# Patient Record
Sex: Female | Born: 1985 | Race: White | Hispanic: No | Marital: Married | Smoking: Former smoker
Health system: Southern US, Community
[De-identification: ages and names within clinical notes are randomized; demographics above are authoritative.]

## PROBLEM LIST (undated history)

## (undated) DIAGNOSIS — Z720 Tobacco use: Secondary | ICD-10-CM

## (undated) DIAGNOSIS — Z789 Other specified health status: Secondary | ICD-10-CM

## (undated) HISTORY — PX: NO PAST SURGERIES: SHX2092

---
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
## 2018-12-11 ENCOUNTER — Other Ambulatory Visit: Payer: Self-pay

## 2018-12-12 ENCOUNTER — Encounter: Payer: Self-pay | Admitting: Family

## 2018-12-12 ENCOUNTER — Ambulatory Visit (INDEPENDENT_AMBULATORY_CARE_PROVIDER_SITE_OTHER): Payer: No Typology Code available for payment source | Admitting: Family

## 2018-12-12 ENCOUNTER — Other Ambulatory Visit: Payer: Self-pay

## 2018-12-12 VITALS — Temp 97.8°F | Ht 62.0 in | Wt 130.0 lb

## 2018-12-12 DIAGNOSIS — R05 Cough: Secondary | ICD-10-CM | POA: Diagnosis not present

## 2018-12-12 DIAGNOSIS — R059 Cough, unspecified: Secondary | ICD-10-CM

## 2018-12-12 DIAGNOSIS — Z720 Tobacco use: Secondary | ICD-10-CM | POA: Insufficient documentation

## 2018-12-12 HISTORY — DX: Tobacco use: Z72.0

## 2018-12-12 MED ORDER — CHANTIX STARTING MONTH PAK 0.5 MG X 11 & 1 MG X 42 PO TABS: ORAL_TABLET | ORAL | 0 refills | Status: DC

## 2018-12-12 NOTE — Progress Notes (Signed)
Virtual Visit via Video Note  I connected with Carrie Sheppard on 12/12/18 at 10:00 AM EDT by a video enabled telemedicine application and verified that I am speaking with the correct person using two identifiers.  Location: Patient: car Provider: Work office   I discussed the limitations of evaluation and management by telemedicine and the availability of in person appointments. The patient expressed understanding and agreed to proceed.  History of Present Illness:  Patient is a 33 yr old female who presents today to establish care.  She was initially scheduled for a face to face visit, but reported to staff that she awoke today cough and mild nasal congestion today so we transitioned to a virtual visit for COVID-19 precautions. She tells me that she is feeling some better after being up for a while.   Pt states that she moved here 1 year ago for from [place] for her job. She works in Aeronautical engineer.   She reports that she smokes 1/2 PPD and is interested in quitting smoking. States she has heard good things about chantix and is interested in trying chantix. States she has tried to quit in the past several times but has only made it a few weeks at a time.  Past Medical History:  Diagnosis Date  . Tobacco abuse 12/12/2018     Social History   Socioeconomic History  . Marital status: Single    Spouse name: Not on file  . Number of children: Not on file  . Years of education: Not on file  . Highest education level: Not on file  Occupational History  . Occupation: development at [employer]  . Financial resource strain: Not hard at all  . Food insecurity    Worry: Never true    Inability: Never true  . Transportation needs    Medical: No    Non-medical: No  Tobacco Use  . Smoking status: Current Every Day Smoker    Packs/day: 0.50    Years: 15.00    Pack years: 7.50    Types: Cigarettes  . Smokeless tobacco: Never Used  Substance and Sexual  Activity  . Alcohol use: Yes    Comment: wine almost every day  . Drug use: Not Currently  . Sexual activity: Yes    Partners: Male    Birth control/protection: None  Lifestyle  . Physical activity    Days per week: 0 days    Minutes per session: Not on file  . Stress: Not on file  Relationships  . Social connections    Talks on phone: More than three times a week    Gets together: Never    Attends religious service: Never    Active member of club or organization: No    Attends meetings of clubs or organizations: Never    Relationship status: Not on file  . Intimate partner violence    Fear of current or ex partner: Not on file    Emotionally abused: Not on file    Physically abused: Not on file    Forced sexual activity: Not on file  Other Topics Concern  . Not on file  Social History Narrative   Works in the [place] to [place] from [place]   No children   Has a dog   Enjoys netflix, some hiking     History reviewed. No pertinent surgical history.  Family History  Problem Relation Age of Onset  . Diabetes  Brother   . Diabetes Sister     Not on File  No current outpatient medications on file prior to visit.   No current facility-administered medications on file prior to visit.     Ht 5\' 2"  (1.575 m)   Wt 130 lb (59 kg)   LMP 11/16/2018   BMI 23.78 kg/m   ROS:  Const: reports stable weight in the last 6 months ENT: + nasal congestion, denies sore throat Resp: mild cough GU: denies dysuria/frequency GI: denies constipation or diarrhea Skin: denies skin rash Neuro: denies headaches MS: denies arthralgias/myalgias Psych: denies depression/anxiety    Observations/Objective:   Gen: Awake, alert, no acute distress Resp: Breathing is even and non-labored Psych: calm/pleasant demeanor Neuro: Alert and Oriented x 3, + facial symmetry, speech is clear.   Assessment and Plan:  Tobacco abuse- discussed various options to  help her quit smoking including nicotine patch, wellbutrin and chantix. She opted for trial of chantix. Common side effects including rare risk of suicide ideation was discussed with the patient today.  Patient is instructed to go directly to the ED if this occurs.  We discussed that patient can continue to smoke for 1 week after starting chantix, but then must discontinue cigarettes.  She is also instructed to contact 01/16/2019 prior to completion of the starter month pack to update [place] on her success at which time we will send an rx for a continuation month pack.  5 minutes spent with patient today on tobacco cessation counseling.   Cough- mild symptoms today. No known covid-19 exposure. Suspect allergies.  She is advised to monitor her symptoms over the weekend and call me Monday if she has new/worsening symptoms. Would plan COVID-19 testing at that time.   20 minutes spent on today's visit.   Follow Up Instructions:    I discussed the assessment and treatment plan with the patient. The patient was provided an opportunity to ask questions and all were answered. The patient agreed with the plan and demonstrated an understanding of the instructions.   The patient was advised to call back or seek an in-person evaluation if the symptoms worsen or if the condition fails to improve as anticipated.  Sunday, NP

## 2019-01-07 ENCOUNTER — Telehealth: Payer: Self-pay | Admitting: Family

## 2019-01-07 NOTE — Telephone Encounter (Signed)
Scheduled cpe for 01/27/2019 1:20pm.  Pt needing refill on chantix. She only has a couple of days.   [facility] [place], [address] [phone] (Phone) [phone] (Fax)

## 2019-01-07 NOTE — Telephone Encounter (Signed)
10/28 11:26am Inocente Salles Can pt be worked in for cpe before the end of November? We would need approval to add additional CPE on a Tuesday or Friday.   Copied from [place] [phone]. Topic: General - Other >> Jan 07, 2019  9:47 AM Carolyn Stare wrote: Pt said she need a cpe before the end of Nov please cal lto

## 2019-01-09 MED ORDER — VARENICLINE TARTRATE 1 MG PO TABS: 1.0000 mg | ORAL_TABLET | Freq: Two times a day (BID) | ORAL | 1 refills | Status: DC

## 2019-01-09 NOTE — Telephone Encounter (Signed)
Patient advised rx was sent she said she is quiting  Successfully and no mood changes so far,.

## 2019-01-09 NOTE — Telephone Encounter (Signed)
Refill has been sent.  Please ask pt if she has been successful quitting. Also, any concerns about mood since starting chantix- depression?

## 2019-01-26 ENCOUNTER — Other Ambulatory Visit: Payer: Self-pay

## 2019-01-27 ENCOUNTER — Encounter: Payer: Self-pay | Admitting: Family

## 2019-01-27 ENCOUNTER — Other Ambulatory Visit: Payer: Self-pay

## 2019-01-27 ENCOUNTER — Ambulatory Visit (INDEPENDENT_AMBULATORY_CARE_PROVIDER_SITE_OTHER): Payer: No Typology Code available for payment source | Admitting: Family

## 2019-01-27 VITALS — BP 126/81 | HR 79 | Temp 97.8°F | Resp 16 | Ht 62.0 in | Wt 136.6 lb

## 2019-01-27 DIAGNOSIS — Z0000 Encounter for general adult medical examination without abnormal findings: Secondary | ICD-10-CM | POA: Diagnosis not present

## 2019-01-27 DIAGNOSIS — Z23 Encounter for immunization: Secondary | ICD-10-CM

## 2019-01-27 LAB — CBC WITH DIFFERENTIAL/PLATELET
Basophils Absolute: 0.1 10*3/uL (ref 0.0–0.1)
Basophils Relative: 1.3 % (ref 0.0–3.0)
Eosinophils Absolute: 0.2 10*3/uL (ref 0.0–0.7)
Eosinophils Relative: 3.7 % (ref 0.0–5.0)
HCT: 39.1 % (ref 36.0–46.0)
Hemoglobin: 13.2 g/dL (ref 12.0–15.0)
Lymphocytes Relative: 41.9 % (ref 12.0–46.0)
Lymphs Abs: 2.7 10*3/uL (ref 0.7–4.0)
MCHC: 33.8 g/dL (ref 30.0–36.0)
MCV: 86.8 fl (ref 78.0–100.0)
Monocytes Absolute: 0.4 10*3/uL (ref 0.1–1.0)
Monocytes Relative: 6.7 % (ref 3.0–12.0)
Neutro Abs: 3 10*3/uL (ref 1.4–7.7)
Neutrophils Relative %: 46.4 % (ref 43.0–77.0)
Platelets: 349 10*3/uL (ref 150.0–400.0)
RBC: 4.5 Mil/uL (ref 3.87–5.11)
RDW: 13.2 % (ref 11.5–15.5)
WBC: 6.5 10*3/uL (ref 4.0–10.5)

## 2019-01-27 LAB — HEPATIC FUNCTION PANEL
ALT: 8 U/L (ref 0–35)
AST: 10 U/L (ref 0–37)
Albumin: 4.7 g/dL (ref 3.5–5.2)
Alkaline Phosphatase: 70 U/L (ref 39–117)
Bilirubin, Direct: 0.1 mg/dL (ref 0.0–0.3)
Total Bilirubin: 0.7 mg/dL (ref 0.2–1.2)
Total Protein: 7.2 g/dL (ref 6.0–8.3)

## 2019-01-27 LAB — BASIC METABOLIC PANEL
BUN: 10 mg/dL (ref 6–23)
CO2: 24 mEq/L (ref 19–32)
Calcium: 9.4 mg/dL (ref 8.4–10.5)
Chloride: 104 mEq/L (ref 96–112)
Creatinine, Ser: 0.69 mg/dL (ref 0.40–1.20)
GFR: 98.38 mL/min (ref >60.00)
Glucose, Bld: 87 mg/dL (ref 70–99)
Potassium: 3.9 mEq/L (ref 3.5–5.1)
Sodium: 139 mEq/L (ref 135–145)

## 2019-01-27 LAB — TSH: TSH: 2.76 u[IU]/mL (ref 0.35–4.50)

## 2019-01-27 LAB — LIPID PANEL
Cholesterol: 206 mg/dL — ABNORMAL HIGH (ref 0–200)
HDL: 52.3 mg/dL (ref >39.00)
LDL Cholesterol: 135 mg/dL — ABNORMAL HIGH (ref 0–99)
NonHDL: 153.37
Total CHOL/HDL Ratio: 4
Triglycerides: 93 mg/dL (ref 0.0–149.0)
VLDL: 18.6 mg/dL (ref 0.0–40.0)

## 2019-01-27 NOTE — Progress Notes (Signed)
Subjective:    Patient ID: Carrie Sheppard, female    DOB: February 10, 1986, 33 y.o.   MRN: [id number]  HPI  Patient is a 33 yr old female who presents today for cpx.  Immunizations:  Tdap 2012-flu shot today. Diet: generally healthy Exercise: walks her dog Pap Smear:overdue, will schedule  Vision/dental: up to  date She is on her second month of chantix and has not smoked in several weeks   Review of Systems  Constitutional: Negative for unexpected weight change.  HENT: Negative for postnasal drip.   Eyes: Negative for visual disturbance.  Respiratory: Negative for cough and shortness of breath.   Cardiovascular: Negative for chest pain.  Gastrointestinal: Negative for blood in stool, constipation and diarrhea.  Genitourinary: Negative for dysuria, frequency and hematuria.  Musculoskeletal: Negative for arthralgias and myalgias.  Skin: Negative for rash.  Neurological: Negative for headaches.  Hematological: Negative for adenopathy.  Psychiatric/Behavioral:       Denies depression/anxiety   Past Medical History:  Diagnosis Date  . Tobacco abuse 12/12/2018     Social History   Socioeconomic History  . Marital status: Single    Spouse name: Not on file  . Number of children: Not on file  . Years of education: Not on file  . Highest education level: Not on file  Occupational History  . Occupation: development at [employer]  Social Needs  . Financial resource strain: Not hard at all  . Food insecurity    Worry: Never true    Inability: Never true  . Transportation needs    Medical: No    Non-medical: No  Tobacco Use  . Smoking status: Former Smoker    Packs/day: 0.50    Years: 15.00    Pack years: 7.50    Types: Cigarettes  . Smokeless tobacco: Never Used  . Tobacco comment: quit 3 weeks ago  Substance and Sexual Activity  . Alcohol use: Yes    Comment: wine almost every day  . Drug use: Not Currently  . Sexual activity: Yes    Partners: Male   Birth control/protection: None  Lifestyle  . Physical activity    Days per week: 0 days    Minutes per session: Not on file  . Stress: Not on file  Relationships  . Social connections    Talks on phone: More than three times a week    Gets together: Never    Attends religious service: Never    Active member of club or organization: No    Attends meetings of clubs or organizations: Never    Relationship status: Not on file  . Intimate partner violence    Fear of current or ex partner: Not on file    Emotionally abused: Not on file    Physically abused: Not on file    Forced sexual activity: Not on file  Other Topics Concern  . Not on file  Social History Narrative   Works in the Visual merchandiser   Moved to [place] from [place]   No children   Has a dog   Enjoys netflix, some hiking     No past surgical history on file.  Family History  Problem Relation Age of Onset  . Diabetes Brother        1/2 brother  . Diabetes Sister        1/2 sister    Not on File  Current Outpatient Medications on File Prior to Visit  Medication Sig Dispense  Refill  . varenicline (CHANTIX CONTINUING MONTH PAK) 1 MG tablet Take 1 tablet (1 mg total) by mouth 2 (two) times daily. 60 tablet 1   No current facility-administered medications on file prior to visit.     BP 126/81 (BP Location: Right Arm, Patient Position: Sitting, Cuff Size: Small)   Pulse 79   Temp 97.8 F (36.6 C) (Temporal)   Resp 16   Ht 5\' 2"  (1.575 m)   Wt 136 lb 9.6 oz (62 kg)   SpO2 100%   BMI 24.98 kg/m       Objective:   Physical Exam  Physical Exam  Constitutional: She is oriented to person, place, and time. She appears well-developed and well-nourished. No distress.  HENT:  Head: Normocephalic and atraumatic.  Right Ear: Tympanic membrane and ear canal normal.  Left Ear: Tympanic membrane and ear canal normal.  Mouth/Throat: not examined, pt wearing mask Eyes: Pupils are equal, round, and  reactive to light. No scleral icterus.  Neck: Normal range of motion. No thyromegaly present.  Cardiovascular: Normal rate and regular rhythm.   No murmur heard. Pulmonary/Chest: Effort normal and breath sounds normal. No respiratory distress. He has no wheezes. She has no rales. She exhibits no tenderness.  Abdominal: Soft. Bowel sounds are normal. She exhibits no distension and no mass. There is no tenderness. There is no rebound and no guarding.  Musculoskeletal: She exhibits no edema.  Lymphadenopathy:    She has no cervical adenopathy.  Neurological: She is alert and oriented to person, place, and time. She has normal patellar reflexes. She exhibits normal muscle tone. Coordination normal.  Skin: Skin is warm and dry.  Psychiatric: She has a normal mood and affect. Her behavior is normal. Judgment and thought content normal.  Breast/pelvis: deferred          Assessment & Plan:  Preventative care- encouraged healthy diet, exercise.  Obtain routine lab work, including nicotine metabolite at pt request for her work form. Encouraged pt to remain smoke free.  Flu shot today.      Assessment & Plan:

## 2019-01-27 NOTE — Patient Instructions (Signed)
Please complete lab work prior to leaving. Try to add 30 minutes of cardio 5 days a week  [place] job quitting smoking, keep up the good work!

## 2019-02-03 LAB — NICOTINE/COTININE METABOLITES
Cotinine: <1 ng/mL
Nicotine: <1 ng/mL

## 2019-02-12 ENCOUNTER — Other Ambulatory Visit: Payer: Self-pay

## 2019-02-13 ENCOUNTER — Ambulatory Visit: Payer: No Typology Code available for payment source | Admitting: Family

## 2019-02-20 ENCOUNTER — Ambulatory Visit: Payer: No Typology Code available for payment source | Admitting: Family

## 2019-03-20 ENCOUNTER — Other Ambulatory Visit (HOSPITAL_COMMUNITY)
Admission: RE | Admit: 2019-03-20 | Discharge: 2019-03-20 | Disposition: A | Discharge status: Home / Self Care | Payer: No Typology Code available for payment source | Source: Ambulatory Visit | Attending: Family | Admitting: Family

## 2019-03-20 ENCOUNTER — Other Ambulatory Visit: Payer: Self-pay

## 2019-03-20 ENCOUNTER — Ambulatory Visit (INDEPENDENT_AMBULATORY_CARE_PROVIDER_SITE_OTHER): Payer: No Typology Code available for payment source | Admitting: Family

## 2019-03-20 VITALS — BP 123/81 | HR 65 | Temp 97.4°F | Resp 16 | Ht 62.0 in | Wt 135.0 lb

## 2019-03-20 DIAGNOSIS — Z01419 Encounter for gynecological examination (general) (routine) without abnormal findings: Secondary | ICD-10-CM

## 2019-03-20 DIAGNOSIS — Z87891 Personal history of nicotine dependence: Secondary | ICD-10-CM | POA: Diagnosis not present

## 2019-03-20 NOTE — Progress Notes (Signed)
Established Patient Office Visit  Subjective:  Patient ID: Carrie Sheppard, female    DOB: 01-08-1986  Age: 34 y.o. MRN: [id number]  CC:  Chief Complaint  Patient presents with  . Gynecologic Exam    Here for pap smear    HPI Carrie Sheppard presents for Pap smear.  Patient's last menstrual period was:  Sexually active: yes The current method of family planning is: History of abnormal Pap:  Had an abnormal pap in college- reports follow up    Tobacco abuse- quit smoking in October. Used chantix the first month which helped. Then she stopped chantix.   GYN exam- normal GYN exam. Pap will be sent for HSV testing.   Past Medical History:  Diagnosis Date  . Tobacco abuse 12/12/2018    No past surgical history on file.  Family History  Problem Relation Age of Onset  . Diabetes Brother        1/2 brother  . Diabetes Sister        1/2 sister    Social History   Socioeconomic History  . Marital status: Single    Spouse name: Not on file  . Number of children: Not on file  . Years of education: Not on file  . Highest education level: Not on file  Occupational History  . 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 Smoking status: Former Smoker    Packs/day: 0.50    Years: 15.00    Pack years: 7.50    Types: Cigarettes  . Smokeless tobacco: Never Used  . Tobacco comment: quit 3 weeks ago  Substance and Sexual Activity  . Alcohol use: Yes    Comment: wine almost every day  . Drug use: Not Currently  . Sexual activity: Yes    Partners: Male    Birth control/protection: None  Other Topics Concern  . Not on file  Social History Narrative   Works in the [place] to [place] from [place]   No children   Has a dog   Enjoys netflix, some hiking    Social Determinants of Radio broadcast assistant Strain: Low Risk   . Difficulty of Paying Living Expenses: Not hard at all  Food Insecurity: No Food Insecurity  .  Worried About Charity fundraiser in the Last Year: Never true  . Ran Out of Food in the Last Year: Never true  Transportation Needs: No Transportation Needs  . Lack of Transportation (Medical): No  . Lack of Transportation (Non-Medical): No  Physical Activity: Unknown  . Days of Exercise per Week: 0 days  . Minutes of Exercise per Session: Not on file  Stress:   . Feeling of Stress : Not on file  Social Connections: Unknown  . Frequency of Communication with Friends and Family: More than three times a week  . Frequency of Social Gatherings with Friends and Family: Never  . Attends Religious Services: Never  . Active Member of Clubs or Organizations: No  . Attends Archivist Meetings: Never  . Marital Status: Not on file  Intimate Partner Violence:   . Fear of Current or Ex-Partner: Not on file  . Emotionally Abused: Not on file  . Physically Abused: Not on file  . Sexually Abused: Not on file    Outpatient Medications Prior to Visit  Medication Sig Dispense Refill  . varenicline (CHANTIX CONTINUING MONTH PAK) 1 MG tablet Take 1 tablet (1 mg total)  by mouth 2 (two) times daily. 60 tablet 1   No facility-administered medications prior to visit.    Not on File  ROS Review of Systems    Objective:    Physical Exam  Constitutional: She is oriented to person, place, and time. She appears well-developed and well-nourished. No distress.  Genitourinary:    Vagina and uterus normal.  Cervix exhibits no motion tenderness. Left adnexum displays no mass.  Musculoskeletal:        General: No edema.  Neurological: She is alert and oriented to person, place, and time.    BP 123/81 (BP Location: Right Arm, Patient Position: Sitting, Cuff Size: Small)   Pulse 65   Temp (!) 97.4 F (36.3 C) (Temporal)   Resp 16   Ht 5\' 2"  (1.575 m)   Wt 135 lb (61.2 kg)   SpO2 100%   BMI 24.69 kg/m      Health Maintenance Due  Topic Date Due  . HIV Screening  09/21/2001  . PAP  SMEAR-Modifier  09/22/2007    There are no preventive care reminders to display for this patient.  Lab Results  Component Value Date   TSH 2.76 01/27/2019   Lab Results  Component Value Date   WBC 6.5 01/27/2019   HGB 13.2 01/27/2019   HCT 39.1 01/27/2019   MCV 86.8 01/27/2019   PLT 349.0 01/27/2019   Lab Results  Component Value Date   NA 139 01/27/2019   K 3.9 01/27/2019   CO2 24 01/27/2019   GLUCOSE 87 01/27/2019   BUN 10 01/27/2019   CREATININE 0.69 01/27/2019   BILITOT 0.7 01/27/2019   ALKPHOS 70 01/27/2019   AST 10 01/27/2019   ALT 8 01/27/2019   PROT 7.2 01/27/2019   ALBUMIN 4.7 01/27/2019   CALCIUM 9.4 01/27/2019   GFR 98.38 01/27/2019   Lab Results  Component Value Date   CHOL 206 (H) 01/27/2019   Lab Results  Component Value Date   HDL 52.30 01/27/2019   Lab Results  Component Value Date   LDLCALC 135 (H) 01/27/2019   Lab Results  Component Value Date   TRIG 93.0 01/27/2019   Lab Results  Component Value Date   CHOLHDL 4 01/27/2019   No results found for: HGBA1C    Assessment & Plan:   Problem List Items Addressed This Visit    None    Visit Diagnoses    Encounter for routine gynecological examination with Papanicolaou smear of cervix    -  Primary   Relevant Orders   Cytology - PAP( [place])     Hx of tobacco abuse- commended pt on quitting smoking.   No orders of the defined types were placed in this encounter.   Follow-up: No follow-ups on file.    01/29/2019, NP

## 2019-03-23 ENCOUNTER — Encounter: Payer: Self-pay | Admitting: Family

## 2019-03-24 LAB — CYTOLOGY - PAP
Adequacy: ABSENT
Comment: NEGATIVE
Diagnosis: NEGATIVE
High risk HPV: NEGATIVE

## 2019-10-06 ENCOUNTER — Ambulatory Visit (INDEPENDENT_AMBULATORY_CARE_PROVIDER_SITE_OTHER): Payer: 59 | Admitting: Family

## 2019-10-06 ENCOUNTER — Encounter: Payer: Self-pay | Admitting: Family

## 2019-10-06 ENCOUNTER — Other Ambulatory Visit: Payer: Self-pay

## 2019-10-06 VITALS — BP 116/82 | HR 69 | Temp 99.0°F | Resp 16 | Ht 62.0 in | Wt 122.0 lb

## 2019-10-06 DIAGNOSIS — H6121 Impacted cerumen, right ear: Secondary | ICD-10-CM

## 2019-10-06 NOTE — Patient Instructions (Signed)
You can apply debrox drops to both ears once weekly and flush with warm water to prevent future wax build up.

## 2019-10-06 NOTE — Progress Notes (Signed)
Subjective:    Patient ID: Carrie Sheppard, female    DOB: 10/07/1985, 34 y.o.   MRN: [id number]  HPI   Right ear fullness x 1 week. Has had no fever or drainage.  Having trouble hearing out of his right ear.     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  Tobacco Use   Smoking status: Former Smoker    Packs/day: 0.50    Years: 15.00    Pack years: 7.50    Types: Cigarettes   Smokeless tobacco: Never Used   Tobacco comment: quit 3 weeks ago  Vaping Use   Vaping Use: Never used  Substance and Sexual Activity   Alcohol use: Yes    Comment: wine almost every day   Drug use: Not Currently   Sexual activity: Yes    Partners: Male    Birth control/protection: None  Other Topics Concern   Not on file  Social History Narrative   Works in the Visual merchandiser   Moved to [place] from [place]   No children   Has a dog   Enjoys netflix, some hiking    Social Determinants of Corporate investment banker Strain: Low Risk    Difficulty of Paying Living Expenses: Not hard at all  Food Insecurity: No Food Insecurity   Worried About Programme researcher, broadcasting/film/video in the Last Year: Never true   Barista in the Last Year: Never true  Transportation Needs: No Transportation Needs   Lack of Transportation (Medical): No   Lack of Transportation (Non-Medical): No  Physical Activity: Unknown   Days of Exercise per Week: 0 days   Minutes of Exercise per Session: Not on file  Stress:    Feeling of Stress :   Social Connections: Unknown   Frequency of Communication with Friends and Family: More than three times a week   Frequency  of Social Gatherings with Friends and Family: Never   Attends Religious Services: Never   Database administrator or Organizations: No   Attends Engineer, structural: Never   Marital Status: Not on file  Intimate Partner Violence:    Fear of Current or Ex-Partner:    Emotionally Abused:    Physically Abused:    Sexually Abused:     No past surgical history on file.  Family History  Problem Relation Age of Onset   Diabetes Brother        1/2 brother   Diabetes Sister        1/2 sister    Not on File  No current outpatient medications on file prior to visit.   No current facility-administered medications on file prior to visit.    BP 116/82 (BP Location: Right Arm, Patient Position: Sitting, Cuff Size: Small)    Pulse 69    Temp 99 F (37.2 C) (Oral)    Resp 16    Ht 5\' 2"  (1.575 m)    Wt 122 lb (55.3 kg)    SpO2 100%    BMI 22.31 kg/m  Objective:   Physical Exam Constitutional:      Appearance: Normal appearance.  HENT:     Head: Normocephalic and atraumatic.     Right Ear: Ear canal normal. There is impacted cerumen.     Left Ear: Tympanic membrane, ear canal and external ear normal.  Neurological:     Mental Status: She is alert.           Assessment & Plan:  Cerumen impaction-right ear was irrigated by CMA.  Wax was successfully removed and patient tolerated procedure.  Following wax removal right eardrum and canal are visualized and are both within normal limits.  Patient reports feeling much improved following wax removal.  She states her hearing is improved as well.  We discussed purchasing over-the-counter Debrox solution and applying drops once weekl followed by irrigation to prevent recurrence of wax buildup.  This visit occurred during the SARS-CoV-2 public health emergency.  Safety protocols were in place, including screening questions prior to the visit, additional usage of staff PPE, and extensive cleaning of exam room while  observing appropriate contact time as indicated for disinfecting solutions.

## 2019-10-27 ENCOUNTER — Other Ambulatory Visit: Payer: Self-pay

## 2019-10-27 ENCOUNTER — Ambulatory Visit (INDEPENDENT_AMBULATORY_CARE_PROVIDER_SITE_OTHER): Payer: 59 | Admitting: Family

## 2019-10-27 ENCOUNTER — Encounter: Payer: Self-pay | Admitting: Family

## 2019-10-27 VITALS — BP 112/74 | HR 94 | Resp 16 | Ht 62.0 in | Wt 123.0 lb

## 2019-10-27 DIAGNOSIS — N926 Irregular menstruation, unspecified: Secondary | ICD-10-CM

## 2019-10-27 DIAGNOSIS — Z3A01 Less than 8 weeks gestation of pregnancy: Secondary | ICD-10-CM | POA: Diagnosis not present

## 2019-10-27 DIAGNOSIS — Z0000 Encounter for general adult medical examination without abnormal findings: Secondary | ICD-10-CM | POA: Diagnosis not present

## 2019-10-27 LAB — CBC WITH DIFFERENTIAL/PLATELET
Basophils Absolute: 0 10*3/uL (ref 0.0–0.1)
Basophils Relative: 0.8 % (ref 0.0–3.0)
Eosinophils Absolute: 0.1 10*3/uL (ref 0.0–0.7)
Eosinophils Relative: 1.8 % (ref 0.0–5.0)
HCT: 41.2 % (ref 36.0–46.0)
Hemoglobin: 13.6 g/dL (ref 12.0–15.0)
Lymphocytes Relative: 38 % (ref 12.0–46.0)
Lymphs Abs: 2.2 10*3/uL (ref 0.7–4.0)
MCHC: 33.1 g/dL (ref 30.0–36.0)
MCV: 88 fl (ref 78.0–100.0)
Monocytes Absolute: 0.4 10*3/uL (ref 0.1–1.0)
Monocytes Relative: 6.6 % (ref 3.0–12.0)
Neutro Abs: 3 10*3/uL (ref 1.4–7.7)
Neutrophils Relative %: 52.8 % (ref 43.0–77.0)
Platelets: 311 10*3/uL (ref 150.0–400.0)
RBC: 4.68 Mil/uL (ref 3.87–5.11)
RDW: 13.2 % (ref 11.5–15.5)
WBC: 5.7 10*3/uL (ref 4.0–10.5)

## 2019-10-27 LAB — BASIC METABOLIC PANEL
BUN: 12 mg/dL (ref 6–23)
CO2: 25 mEq/L (ref 19–32)
Calcium: 9.8 mg/dL (ref 8.4–10.5)
Chloride: 104 mEq/L (ref 96–112)
Creatinine, Ser: 0.7 mg/dL (ref 0.40–1.20)
GFR: 96.31 mL/min (ref >60.00)
Glucose, Bld: 91 mg/dL (ref 70–99)
Potassium: 4.1 mEq/L (ref 3.5–5.1)
Sodium: 138 mEq/L (ref 135–145)

## 2019-10-27 LAB — LIPID PANEL
Cholesterol: 185 mg/dL (ref 0–200)
HDL: 55.4 mg/dL (ref >39.00)
LDL Cholesterol: 118 mg/dL — ABNORMAL HIGH (ref 0–99)
NonHDL: 129.69
Total CHOL/HDL Ratio: 3
Triglycerides: 59 mg/dL (ref 0.0–149.0)
VLDL: 11.8 mg/dL (ref 0.0–40.0)

## 2019-10-27 LAB — POCT URINE PREGNANCY: Preg Test, Ur: POSITIVE — AB

## 2019-10-27 LAB — HEPATIC FUNCTION PANEL
ALT: 8 U/L (ref 0–35)
AST: 10 U/L (ref 0–37)
Albumin: 4.9 g/dL (ref 3.5–5.2)
Alkaline Phosphatase: 53 U/L (ref 39–117)
Bilirubin, Direct: 0.1 mg/dL (ref 0.0–0.3)
Total Bilirubin: 0.6 mg/dL (ref 0.2–1.2)
Total Protein: 7.5 g/dL (ref 6.0–8.3)

## 2019-10-27 LAB — TSH: TSH: 3.18 u[IU]/mL (ref 0.35–4.50)

## 2019-10-27 MED ORDER — PRENATAL VITAMIN 27-0.8 MG PO TABS: 1.0000 | ORAL_TABLET | Freq: Every day | ORAL | Status: DC

## 2019-10-27 NOTE — Progress Notes (Signed)
Subjective:    Patient ID: Carrie Sheppard, female    DOB: 1986/01/05, 34 y.o.   MRN: [id number]  HPI  Patient presents today for complete physical.  Immunizations:  She had the moderna vaccine.   Diet: healthy, lots of vegetables.   Wt Readings from Last 3 Encounters:  10/27/19 123 lb (55.8 kg)  10/06/19 122 lb (55.3 kg)  03/20/19 135 lb (61.2 kg)  Exercise: walks the dog.   Pap Smear: 03/20/19 Vision: due Dental: up to date  LMP 09/17/19- had positive pregnancy test at home. Reports + urinary frequency, cramping, constipation. Denies nausea. She is taking a prenatal vitamin.    Review of Systems  Constitutional: Negative for unexpected weight change.  HENT: Negative for hearing loss and rhinorrhea.   Eyes: Negative for visual disturbance.  Respiratory: Negative for cough and shortness of breath.   Cardiovascular: Negative for chest pain.  Gastrointestinal: Positive for constipation. Negative for blood in stool.  Genitourinary: Positive for frequency. Negative for dysuria and hematuria.  Musculoskeletal: Negative for arthralgias and myalgias.  Skin: Negative for rash.  Neurological: Negative for headaches.  Hematological: Negative for adenopathy.  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  Tobacco Use   Smoking status: Former Smoker    Packs/day: 0.50    Years: 15.00    Pack years: 7.50    Types: Cigarettes   Smokeless tobacco: Never Used   Tobacco comment: quit 3 weeks ago  Vaping Use   Vaping Use: Never used  Substance and Sexual Activity   Alcohol use: Yes    Comment: wine almost every day    Drug use: Not Currently   Sexual activity: Yes    Partners: Male    Birth control/protection: None  Other Topics Concern   Not on file  Social History Narrative   Works in the Visual merchandiser   Moved to [place] from [place]   No children   Has a dog   Enjoys netflix, some hiking    Social Determinants of Corporate investment banker Strain: Low Risk    Difficulty of Paying Living Expenses: Not hard at all  Food Insecurity: No Food Insecurity   Worried About Programme researcher, broadcasting/film/video in the Last Year: Never true   Barista in the Last Year: Never true  Transportation Needs: No Transportation Needs   Lack of Transportation (Medical): No   Lack of Transportation (Non-Medical): No  Physical Activity: Unknown   Days of Exercise per Week: 0 days   Minutes of Exercise per Session: Not on file  Stress:    Feeling of Stress :   Social Connections: Unknown   Frequency of Communication with Friends and Family: More than three times a week   Frequency of Social Gatherings with Friends and Family: Never   Attends Religious Services: Never   Database administrator or Organizations: No   Attends Banker Meetings: Never   Marital Status: Not on file  Intimate Partner Violence:    Fear of Current or Ex-Partner:  Emotionally Abused:    Physically Abused:    Sexually Abused:     No past surgical history on file.  Family History  Problem Relation Age of Onset   Diabetes Brother        1/2 brother   Diabetes Sister        1/2 sister    Not on File  No current outpatient medications on file prior to visit.   No current facility-administered medications on file prior to visit.    BP 112/74 (BP Location: Left Arm, Patient Position: Sitting, Cuff Size: Normal)    Pulse 94    Resp 16    Ht 5\' 2"  (1.575 m)    Wt 123 lb (55.8 kg)    LMP 09/17/2019    SpO2 98%    BMI 22.50 kg/m        Objective:   Physical Exam Constitutional:       Appearance: Normal appearance. She is well-developed and normal weight.  HENT:     Head:     Comments: Mild cerumen noted bilaterally    Right Ear: Tympanic membrane normal.     Left Ear: Tympanic membrane normal.  Cardiovascular:     Rate and Rhythm: Normal rate and regular rhythm.     Heart sounds: Normal heart sounds. No murmur heard.   Pulmonary:     Effort: Pulmonary effort is normal. No respiratory distress.     Breath sounds: Normal breath sounds. No wheezing.  Abdominal:     General: Bowel sounds are normal. There is no distension.     Palpations: Abdomen is soft.     Tenderness: There is no abdominal tenderness.  Musculoskeletal:        General: No swelling.  Skin:    General: Skin is warm and dry.  Neurological:     Mental Status: She is alert and oriented to person, place, and time.     Cranial Nerves: No cranial nerve deficit.     Motor: No weakness.  Psychiatric:        Behavior: Behavior normal.        Thought Content: Thought content normal.        Judgment: Judgment normal.           Assessment & Plan:  Preventative care- discussed importance of healthy diet and regular exercise.  She reports that she completed the [facility] Covid series.   Pap and tetanus up to date. She has a form for work which is requesting a nicotine level.  Early Pregnancy- + pregnancy test here in the office. refer to OB/GYN. Discussed need to continue prenatal vitamin. Discussed foods to avoid during pregnancy.    This visit occurred during the SARS-CoV-2 public health emergency.  Safety protocols were in place, including screening questions prior to the visit, additional usage of staff PPE, and extensive cleaning of exam room while observing appropriate contact time as indicated for disinfecting solutions.

## 2019-10-27 NOTE — Patient Instructions (Addendum)
Please complete lab work prior to leaving. Continue prenatal vitamin. Continue healthy diet, good hydration and regular exercise. You should be contacted about scheduling your appointment with OB/GYN.   First Trimester of Pregnancy The first trimester of pregnancy is from week 1 until the end of week 13 (months 1 through 3). A week after a sperm fertilizes an egg, the egg will implant on the wall of the uterus. This embryo will begin to develop into a baby. Genes from you and your partner will form the baby. The female genes will determine whether the baby will be a boy or a girl. At 6-8 weeks, the eyes and face will be formed, and the heartbeat can be seen on ultrasound. At the end of 12 weeks, all the baby's organs will be formed. Now that you are pregnant, you will want to do everything you can to have a healthy baby. Two of the most important things are to get good prenatal care and to follow your health care provider's instructions. Prenatal care is all the medical care you receive before the baby's birth. This care will help prevent, find, and treat any problems during the pregnancy and childbirth. Body changes during your first trimester Your body goes through many changes during pregnancy. The changes vary from woman to woman.  You may gain or lose a couple of pounds at first.  You may feel sick to your stomach (nauseous) and you may throw up (vomit). If the vomiting is uncontrollable, call your health care provider.  You may tire easily.  You may develop headaches that can be relieved by medicines. All medicines should be approved by your health care provider.  You may urinate more often. Painful urination may mean you have a bladder infection.  You may develop heartburn as a result of your pregnancy.  You may develop constipation because certain hormones are causing the muscles that push stool through your intestines to slow down.  You may develop hemorrhoids or swollen veins  (varicose veins).  Your breasts may begin to grow larger and become tender. Your nipples may stick out more, and the tissue that surrounds them (areola) may become darker.  Your gums may bleed and may be sensitive to brushing and flossing.  Dark spots or blotches (chloasma, mask of pregnancy) may develop on your face. This will likely fade after the baby is born.  Your menstrual periods will stop.  You may have a loss of appetite.  You may develop cravings for certain kinds of food.  You may have changes in your emotions from day to day, such as being excited to be pregnant or being concerned that something may go wrong with the pregnancy and baby.  You may have more vivid and strange dreams.  You may have changes in your hair. These can include thickening of your hair, rapid growth, and changes in texture. Some women also have hair loss during or after pregnancy, or hair that feels dry or thin. Your hair will most likely return to normal after your baby is born. What to expect at prenatal visits During a routine prenatal visit:  You will be weighed to make sure you and the baby are growing normally.  Your blood pressure will be taken.  Your abdomen will be measured to track your baby's growth.  The fetal heartbeat will be listened to between weeks 10 and 14 of your pregnancy.  Test results from any previous visits will be discussed. Your health care provider may ask you:  How you are feeling.  If you are feeling the baby move.  If you have had any abnormal symptoms, such as leaking fluid, bleeding, severe headaches, or abdominal cramping.  If you are using any tobacco products, including cigarettes, chewing tobacco, and electronic cigarettes.  If you have any questions. Other tests that may be performed during your first trimester include:  Blood tests to find your blood type and to check for the presence of any previous infections. The tests will also be used to check  for low iron levels (anemia) and protein on red blood cells (Rh antibodies). Depending on your risk factors, or if you previously had diabetes during pregnancy, you may have tests to check for high blood sugar that affects pregnant women (gestational diabetes).  Urine tests to check for infections, diabetes, or protein in the urine.  An ultrasound to confirm the proper growth and development of the baby.  Fetal screens for spinal cord problems (spina bifida) and Down syndrome.  HIV (human immunodeficiency virus) testing. Routine prenatal testing includes screening for HIV, unless you choose not to have this test.  You may need other tests to make sure you and the baby are doing well. Follow these instructions at home: Medicines  Follow your health care provider's instructions regarding medicine use. Specific medicines may be either safe or unsafe to take during pregnancy.  Take a prenatal vitamin that contains at least 600 micrograms (mcg) of folic acid.  If you develop constipation, try taking a stool softener if your health care provider approves. Eating and drinking   Eat a balanced diet that includes fresh fruits and vegetables, whole grains, good sources of protein such as meat, eggs, or tofu, and low-fat dairy. Your health care provider will help you determine the amount of weight gain that is right for you.  Avoid raw meat and uncooked cheese. These carry germs that can cause birth defects in the baby.  Eating four or five small meals rather than three large meals a day may help relieve nausea and vomiting. If you start to feel nauseous, eating a few soda crackers can be helpful. Drinking liquids between meals, instead of during meals, also seems to help ease nausea and vomiting.  Limit foods that are high in fat and processed sugars, such as fried and sweet foods.  To prevent constipation: ? Eat foods that are high in fiber, such as fresh fruits and vegetables, whole grains,  and beans. ? Drink enough fluid to keep your urine clear or pale yellow. Activity  Exercise only as directed by your health care provider. Most women can continue their usual exercise routine during pregnancy. Try to exercise for 30 minutes at least 5 days a week. Exercising will help you: ? Control your weight. ? Stay in shape. ? Be prepared for labor and delivery.  Experiencing pain or cramping in the lower abdomen or lower back is a good sign that you should stop exercising. Check with your health care provider before continuing with normal exercises.  Try to avoid standing for long periods of time. Move your legs often if you must stand in one place for a long time.  Avoid heavy lifting.  Wear low-heeled shoes and practice good posture.  You may continue to have sex unless your health care provider tells you not to. Relieving pain and discomfort  Wear a good support bra to relieve breast tenderness.  Take warm sitz baths to soothe any pain or discomfort caused by hemorrhoids. Use hemorrhoid  cream if your health care provider approves.  Rest with your legs elevated if you have leg cramps or low back pain.  If you develop varicose veins in your legs, wear support hose. Elevate your feet for 15 minutes, 3-4 times a day. Limit salt in your diet. Prenatal care  Schedule your prenatal visits by the twelfth week of pregnancy. They are usually scheduled monthly at first, then more often in the last 2 months before delivery.  Write down your questions. Take them to your prenatal visits.  Keep all your prenatal visits as told by your health care provider. This is important. Safety  Wear your seat belt at all times when driving.  Make a list of emergency phone numbers, including numbers for family, friends, the hospital, and police and fire departments. General instructions  Ask your health care provider for a referral to a local prenatal education class. Begin classes no later than  the beginning of month 6 of your pregnancy.  Ask for help if you have counseling or nutritional needs during pregnancy. Your health care provider can offer advice or refer you to specialists for help with various needs.  Do not use hot tubs, steam rooms, or saunas.  Do not douche or use tampons or scented sanitary pads.  Do not cross your legs for long periods of time.  Avoid cat litter boxes and soil used by cats. These carry germs that can cause birth defects in the baby and possibly loss of the fetus by miscarriage or stillbirth.  Avoid all smoking, herbs, alcohol, and medicines not prescribed by your health care provider. Chemicals in these products affect the formation and growth of the baby.  Do not use any products that contain nicotine or tobacco, such as cigarettes and e-cigarettes. If you need help quitting, ask your health care provider. You may receive counseling support and other resources to help you quit.  Schedule a dentist appointment. At home, brush your teeth with a soft toothbrush and be gentle when you floss. Contact a health care provider if:  You have dizziness.  You have mild pelvic cramps, pelvic pressure, or nagging pain in the abdominal area.  You have persistent nausea, vomiting, or diarrhea.  You have a bad smelling vaginal discharge.  You have pain when you urinate.  You notice increased swelling in your face, hands, legs, or ankles.  You are exposed to fifth disease or chickenpox.  You are exposed to [place] measles (rubella) and have never had it. Get help right away if:  You have a fever.  You are leaking fluid from your vagina.  You have spotting or bleeding from your vagina.  You have severe abdominal cramping or pain.  You have rapid weight gain or loss.  You vomit blood or material that looks like coffee grounds.  You develop a severe headache.  You have shortness of breath.  You have any kind of trauma, such as from a fall or a  car accident. Summary  The first trimester of pregnancy is from week 1 until the end of week 13 (months 1 through 3).  Your body goes through many changes during pregnancy. The changes vary from woman to woman.  You will have routine prenatal visits. During those visits, your health care provider will examine you, discuss any test results you may have, and talk with you about how you are feeling. This information is not intended to replace advice given to you by your health care provider. Make sure you  discuss any questions you have with your health care provider. Document Revised: 02/08/2017 Document Reviewed: 02/08/2016 Elsevier Patient Education  2020 [employer].

## 2019-10-30 LAB — NICOTINE/COTININE METABOLITES
Cotinine: <1 ng/mL
Nicotine: <1 ng/mL

## 2019-10-31 ENCOUNTER — Encounter: Payer: Self-pay | Admitting: Family

## 2019-11-02 ENCOUNTER — Inpatient Hospital Stay (HOSPITAL_COMMUNITY): Payer: 59

## 2019-11-02 ENCOUNTER — Telehealth: Payer: Self-pay

## 2019-11-02 ENCOUNTER — Inpatient Hospital Stay (HOSPITAL_COMMUNITY)
Admission: AD | Admit: 2019-11-02 | Discharge: 2019-11-02 | Disposition: A | Discharge status: Home / Self Care | Payer: 59 | Attending: Obstetrics and Gynecology | Admitting: Obstetrics and Gynecology

## 2019-11-02 ENCOUNTER — Other Ambulatory Visit: Payer: Self-pay

## 2019-11-02 ENCOUNTER — Encounter (HOSPITAL_COMMUNITY): Payer: Self-pay | Admitting: Obstetrics and Gynecology

## 2019-11-02 DIAGNOSIS — O3680X0 Pregnancy with inconclusive fetal viability, not applicable or unspecified: Secondary | ICD-10-CM | POA: Diagnosis not present

## 2019-11-02 DIAGNOSIS — O208 Other hemorrhage in early pregnancy: Secondary | ICD-10-CM | POA: Diagnosis not present

## 2019-11-02 DIAGNOSIS — Z6790 Unspecified blood type, Rh positive: Secondary | ICD-10-CM

## 2019-11-02 DIAGNOSIS — Z3A01 Less than 8 weeks gestation of pregnancy: Secondary | ICD-10-CM | POA: Diagnosis not present

## 2019-11-02 DIAGNOSIS — Z87891 Personal history of nicotine dependence: Secondary | ICD-10-CM | POA: Diagnosis not present

## 2019-11-02 DIAGNOSIS — O4690 Antepartum hemorrhage, unspecified, unspecified trimester: Secondary | ICD-10-CM

## 2019-11-02 DIAGNOSIS — O209 Hemorrhage in early pregnancy, unspecified: Secondary | ICD-10-CM | POA: Insufficient documentation

## 2019-11-02 HISTORY — DX: Other specified health status: Z78.9

## 2019-11-02 LAB — COMPREHENSIVE METABOLIC PANEL
ALT: 11 U/L (ref 0–44)
AST: 12 U/L — ABNORMAL LOW (ref 15–41)
Albumin: 4.5 g/dL (ref 3.5–5.0)
Alkaline Phosphatase: 52 U/L (ref 38–126)
Anion gap: 8 (ref 5–15)
BUN: 8 mg/dL (ref 6–20)
CO2: 24 mmol/L (ref 22–32)
Calcium: 9.2 mg/dL (ref 8.9–10.3)
Chloride: 108 mmol/L (ref 98–111)
Creatinine, Ser: 0.74 mg/dL (ref 0.44–1.00)
GFR calc Af Amer: >60 mL/min (ref >60)
GFR calc non Af Amer: >60 mL/min (ref >60)
Glucose, Bld: 103 mg/dL — ABNORMAL HIGH (ref 70–99)
Potassium: 3.7 mmol/L (ref 3.5–5.1)
Sodium: 140 mmol/L (ref 135–145)
Total Bilirubin: 0.6 mg/dL (ref 0.3–1.2)
Total Protein: 7.5 g/dL (ref 6.5–8.1)

## 2019-11-02 LAB — URINALYSIS, ROUTINE W REFLEX MICROSCOPIC
Bilirubin Urine: NEGATIVE
Glucose, UA: NEGATIVE mg/dL
Ketones, ur: NEGATIVE mg/dL
Leukocytes,Ua: NEGATIVE
Nitrite: NEGATIVE
Protein, ur: NEGATIVE mg/dL
Specific Gravity, Urine: 1.002 — ABNORMAL LOW (ref 1.005–1.030)
pH: 6 (ref 5.0–8.0)

## 2019-11-02 LAB — CBC
HCT: 39.4 % (ref 36.0–46.0)
Hemoglobin: 13.1 g/dL (ref 12.0–15.0)
MCH: 28.7 pg (ref 26.0–34.0)
MCHC: 33.2 g/dL (ref 30.0–36.0)
MCV: 86.2 fL (ref 80.0–100.0)
Platelets: 311 10*3/uL (ref 150–400)
RBC: 4.57 MIL/uL (ref 3.87–5.11)
RDW: 12.4 % (ref 11.5–15.5)
WBC: 7 10*3/uL (ref 4.0–10.5)
nRBC: 0 % (ref 0.0–0.2)

## 2019-11-02 LAB — WET PREP, GENITAL
Sperm: NONE SEEN
Trich, Wet Prep: NONE SEEN
Yeast Wet Prep HPF POC: NONE SEEN

## 2019-11-02 LAB — HCG, QUANTITATIVE, PREGNANCY: hCG, Beta Chain, Quant, S: 179 m[IU]/mL — ABNORMAL HIGH (ref <5)

## 2019-11-02 LAB — ABO/RH: ABO/RH(D): A POS

## 2019-11-02 NOTE — Telephone Encounter (Signed)
Pt's husband called stating pt was referred to our office on 10/27/19 by her PCP as a new ob. Pt's husband states wife is bleeding a passing blood clots. Advised pt's husband to take pt to [facility] at [facility] at [address] [facility]. Understanding was voiced.  Chiante Peden l Siddh Vandeventer, CMA

## 2019-11-02 NOTE — Discharge Instructions (Signed)
Ectopic Pregnancy ° °An ectopic pregnancy is when the fertilized egg attaches (implants) outside the uterus. Most ectopic pregnancies occur in one of the tubes where eggs travel from the ovary to the uterus (fallopian tubes), but the implanting can occur in other locations. In rare cases, ectopic pregnancies occur on the ovary, intestine, pelvis, abdomen, or cervix. In an ectopic pregnancy, the fertilized egg does not have the ability to develop into a normal, healthy baby. °A ruptured ectopic pregnancy is one in which tearing or bursting of a fallopian tube causes internal bleeding. Often, there is intense lower abdominal pain, and vaginal bleeding sometimes occurs. Having an ectopic pregnancy can be life-threatening. If this dangerous condition is not treated, it can lead to blood loss, shock, or even death. °What are the causes? °The most common cause of this condition is damage to one of the fallopian tubes. A fallopian tube may be narrowed or blocked, and that keeps the fertilized egg from reaching the uterus. °What increases the risk? °This condition is more likely to develop in women of childbearing age who have different levels of risk. The levels of risk can be divided into three categories. °High risk °· You have gone through infertility treatment. °· You have had an ectopic pregnancy before. °· You have had surgery on the fallopian tubes, or another surgical procedure, such as an abortion. °· You have had surgery to have the fallopian tubes tied (tubal ligation). °· You have problems or diseases of the fallopian tubes. °· You have been exposed to diethylstilbestrol (DES). This medicine was used until 1971, and it had effects on babies whose mothers took the medicine. °· You become pregnant while using an IUD (intrauterine device) for birth control. °Moderate risk °· You have a history of infertility. °· You have had an STI (sexually transmitted infection). °· You have a history of pelvic inflammatory  disease (PID). °· You have scarring from endometriosis. °· You have multiple sexual partners. °· You smoke. °Low risk °· You have had pelvic surgery. °· You use vaginal douches. °· You became sexually active before age 18. °What are the signs or symptoms? °Common symptoms of this condition include normal pregnancy symptoms, such as missing a period, nausea, tiredness, abdominal pain, breast tenderness, and bleeding. However, ectopic pregnancy will have additional symptoms, such as: °· Pain with intercourse. °· Irregular vaginal bleeding or spotting. °· Cramping or pain on one side or in the lower abdomen. °· Fast heartbeat, low blood pressure, and sweating. °· Passing out while having a bowel movement. °Symptoms of a ruptured ectopic pregnancy and internal bleeding may include: °· Sudden, severe pain in the abdomen and pelvis. °· Dizziness, weakness, light-headedness, or fainting. °· Pain in the shoulder or neck area. °How is this diagnosed? °This condition is diagnosed by: °· A pelvic exam to locate pain or a mass in the abdomen. °· A pregnancy test. This blood test checks for the presence as well as the specific level of pregnancy hormone in the bloodstream. °· Ultrasound. This is performed if a pregnancy test is positive. In this test, a probe is inserted into the vagina. The probe will detect a fetus, possibly in a location other than the uterus. °· Taking a sample of uterus tissue (dilation and curettage, or D&C). °· Surgery to perform a visual exam of the inside of the abdomen using a thin, lighted tube that has a tiny camera on the end (laparoscope). °· Culdocentesis. This procedure involves inserting a needle at the top of   the vagina, behind the uterus. If blood is present in this area, it may indicate that a fallopian tube is torn. How is this treated? This condition is treated with medicine or surgery. Medicine  An injection of a medicine (methotrexate) may be given to cause the pregnancy tissue to be  absorbed. This medicine may save your fallopian tube. It may be given if: ? The diagnosis is made early, with no signs of active bleeding. ? The fallopian tube has not ruptured. ? You are considered to be a good candidate for the medicine. Usually, pregnancy hormone blood levels are checked after methotrexate treatment. This is to be sure that the medicine is effective. It may take 4-6 weeks for the pregnancy to be absorbed. Most pregnancies will be absorbed by 3 weeks. Surgery  A laparoscope may be used to remove the pregnancy tissue.  If severe internal bleeding occurs, a larger cut (incision) may be made in the lower abdomen (laparotomy) to remove the fetus and placenta. This is done to stop the bleeding.  Part or all of the fallopian tube may be removed (salpingectomy) along with the fetus and placenta. The fallopian tube may also be repaired during the surgery.  In very rare circumstances, removal of the uterus (hysterectomy) may be required.  After surgery, pregnancy hormone testing may be done to be sure that there is no pregnancy tissue left. Whether your treatment is medicine or surgery, you may receive a Rho (D) immune globulin shot to prevent problems with any future pregnancy. This shot may be given if:  You are Rh-negative and the baby's father is Rh-positive.  You are Rh-negative and you do not know the Rh type of the baby's father. Follow these instructions at home:  Rest and limit your activity after the procedure for as long as told by your health care provider.  Until your health care provider says that it is safe: ? Do not lift anything that is heavier than 10 lb (4.5 kg), or the limit that your health care provider tells you. ? Avoid physical exercise and any movement that requires effort (is strenuous).  To help prevent constipation: ? Eat a healthy diet that includes fruits, vegetables, and whole grains. ? Drink 6-8 glasses of water per day. Get help right away  if:  You develop worsening pain that is not relieved by medicine.  You have: ? A fever or chills. ? Vaginal bleeding. ? Redness and swelling at the incision site. ? Nausea and vomiting.  You feel dizzy or weak.  You feel light-headed or you faint. This information is not intended to replace advice given to you by your health care provider. Make sure you discuss any questions you have with your health care provider. Document Revised: 02/08/2017 Document Reviewed: 09/28/2015 Elsevier Patient Education  [employer] A miscarriage is the loss of an unborn baby (fetus) before the 20th week of pregnancy. Most miscarriages happen during the first 3 months of pregnancy. Sometimes, a miscarriage can happen before a woman knows that she is pregnant. Having a miscarriage can be an emotional experience. If you have had a miscarriage, talk with your health care provider about any questions you may have about miscarrying, the grieving process, and your plans for future pregnancy. What are the causes? A miscarriage may be caused by:  Problems with the genes or chromosomes of the fetus. These problems make it impossible for the baby to develop normally. They are  often the result of random errors that occur early in the development of the baby, and are not passed from parent to child (not inherited).  Infection of the cervix or uterus.  Conditions that affect hormone balance in the body.  Problems with the cervix, such as the cervix opening and thinning before pregnancy is at term (cervical insufficiency).  Problems with the uterus. These may include: ? A uterus with an abnormal shape. ? Fibroids in the uterus. ? Congenital abnormalities. These are problems that were present at birth.  Certain medical conditions.  Smoking, drinking alcohol, or using drugs.  Injury (trauma). In many cases, the cause of a miscarriage is not known. What are the signs or  symptoms? Symptoms of this condition include:  Vaginal bleeding or spotting, with or without cramps or pain.  Pain or cramping in the abdomen or lower back.  Passing fluid, tissue, or blood clots from the vagina. How is this diagnosed? This condition may be diagnosed based on:  A physical exam.  Ultrasound.  Blood tests.  Urine tests. How is this treated? Treatment for a miscarriage is sometimes not necessary if you naturally pass all the tissue that was in your uterus. If necessary, this condition may be treated with:  Dilation and curettage (D&C). This is a procedure in which the cervix is stretched open and the lining of the uterus (endometrium) is scraped. This is done only if tissue from the fetus or placenta remains in the body (incomplete miscarriage).  Medicines, such as: ? Antibiotic medicine, to treat infection. ? Medicine to help the body pass any remaining tissue. ? Medicine to reduce (contract) the size of the uterus. These medicines may be given if you have a lot of bleeding. If you have Rh negative blood and your baby was Rh positive, you will need a shot of a medicine called Rh immunoglobulinto protect your future babies from Rh blood problems. "Rh-negative" and "Rh-positive" refer to whether or not the blood has a specific protein found on the surface of red blood cells (Rh factor). Follow these instructions at home: Medicines   Take over-the-counter and prescription medicines only as told by your health care provider.  If you were prescribed antibiotic medicine, take it as told by your health care provider. Do not stop taking the antibiotic even if you start to feel better.  Do not take NSAIDs, such as aspirin and ibuprofen, unless they are approved by your health care provider. These medicines can cause bleeding. Activity  Rest as directed. Ask your health care provider what activities are safe for you.  Have someone help with home and family  responsibilities during this time. General instructions  Keep track of the number of sanitary pads you use each day and how soaked (saturated) they are. Write down this information.  Monitor the amount of tissue or blood clots that you pass from your vagina. Save any large amounts of tissue for your health care provider to examine.  Do not use tampons, douche, or have sex until your health care provider approves.  To help you and your partner with the process of grieving, talk with your health care provider or seek counseling.  When you are ready, meet with your health care provider to discuss any important steps you should take for your health. Also, discuss steps you should take to have a healthy pregnancy in the future.  Keep all follow-up visits as told by your health care provider. This is important. Where to find more  information  The American Congress of Obstetricians and Gynecologists: www.acog.org  [place] Department of Health and Cytogeneticist of Women's Health: http://hoffman.com/ Contact a health care provider if:  You have a fever or chills.  You have a foul smelling vaginal discharge.  You have more bleeding instead of less. Get help right away if:  You have severe cramps or pain in your back or abdomen.  You pass blood clots or tissue from your vagina that is walnut-sized or larger.  You soak more than 1 regular sanitary pad in an hour.  You become light-headed or weak.  You pass out.  You have feelings of sadness that take over your thoughts, or you have thoughts of hurting yourself. Summary  Most miscarriages happen in the first 3 months of pregnancy. Sometimes miscarriage happens before a woman even knows that she is pregnant.  Follow your health care provider's instruction for home care. Keep all follow-up appointments.  To help you and your partner with the process of grieving, talk with your health care provider or seek counseling. This  information is not intended to replace advice given to you by your health care provider. Make sure you discuss any questions you have with your health care provider. Document Revised: 06/20/2018 Document Reviewed: 04/03/2016 Elsevier Patient Education  2020 [employer] Inc[employer]        Managing Pregnancy Loss Pregnancy loss can happen any time during a pregnancy. Often the cause is not known. It is rarely because of anything you did. Pregnancy loss in early pregnancy (during the first trimester) is called a miscarriage. This type of pregnancy loss is the most common. Pregnancy loss that happens after 20 weeks of pregnancy is called fetal demise if the baby's heart stops beating before birth. Fetal demise is much less common. Some women experience spontaneous labor shortly after fetal demise resulting in a stillborn birth (stillbirth). Any pregnancy loss can be devastating. You will need to recover both physically and emotionally. Most women are able to get pregnant again after a pregnancy loss and deliver a healthy baby. How to manage emotional recovery  Pregnancy loss is very hard emotionally. You may feel many different emotions while you grieve. You may feel sad and angry. You may also feel guilty. It is normal to have periods of crying. Emotional recovery can take longer than physical recovery. It is different for everyone. Taking these steps can help you in managing this loss:  Remember that it is unlikely you did anything to cause the pregnancy loss.  Share your thoughts and feelings with friends, family, and your partner. Remember that your partner is also recovering emotionally.  Make sure you have a good support system. Do not spend too much time alone.  Meet with a pregnancy loss counselor or join a pregnancy loss support group.  Get enough sleep and eat a healthy diet. Return to regular exercise when you have recovered physically.  Do not use drugs or alcohol to manage your  emotions.  Consider seeing a mental health professional to help you recover emotionally.  Ask a friend or loved one to help you decide what to do with any clothing and nursery items you received for your baby. In the case of a stillbirth, many women benefit from taking additional steps in the grieving process. You may want to:  Hold your baby after the birth.  Name your baby.  Request a birth certificate.  Create a keepsake such as handprints or footprints.  Dress your baby and  have a picture taken.  Make funeral arrangements.  Ask for a baptism or blessing. Hospitals have staff members who can help you with all these arrangements. How to recognize emotional stress It is normal to have emotional stress after a pregnancy loss. But emotional stress that lasts a long time or becomes severe requires treatment. Watch out for these signs of severe emotional stress:  Sadness, anger, or guilt that is not going away and is interfering with your normal activities.  Relationship problems that have occurred or gotten worse since the pregnancy loss.  Signs of depression that last longer than 2 weeks. These may include: ? Sadness. ? Anxiety. ? Hopelessness. ? Loss of interest in activities you enjoy. ? Inability to concentrate. ? Trouble sleeping or sleeping too much. ? Loss of appetite or overeating. ? Thoughts of death or of hurting yourself. Follow these instructions at home:  Take over-the-counter and prescription medicines only as told by your health care provider.  Rest at home until your energy level returns. Return to your normal activities as told by your health care provider. Ask your health care provider what activities are safe for you.  When you are ready, meet with your health care provider to discuss steps to take for a future pregnancy.  Keep all follow-up visits as told by your health care provider. This is important. Where to find support  To help you and your  partner with the process of grieving, talk with your health care provider or seek counseling.  Consider meeting with others who have experienced pregnancy loss. Ask your health care provider about support groups and resources. Where to find more information  [place] Department of Health and Cytogeneticist on Women's Health: http://hoffman.com/  American Pregnancy Association: www.americanpregnancy.org Contact a health care provider if:  You continue to experience grief, sadness, or lack of motivation for everyday activities, and those feelings do not improve over time.  You are struggling to recover emotionally, especially if you are using alcohol or substances to help. Get help right away if:  You have thoughts of hurting yourself or others. If you ever feel like you may hurt yourself or others, or have thoughts about taking your own life, get help right away. You can go to your nearest emergency department or call:  Your local emergency services (911 in the [place]  A suicide crisis helpline, such as the National Suicide Prevention Lifeline at (902) 462-3258. This is open 24 hours a day. Summary  Any pregnancy loss can be difficult physically and emotionally.  You may experience many different emotions while you grieve. Emotional recovery can last longer than physical recovery.  It is normal to have emotional stress after a pregnancy loss. But emotional stress that lasts a long time or becomes severe requires treatment.  See your health care provider if you are struggling emotionally after a pregnancy loss. This information is not intended to replace advice given to you by your health care provider. Make sure you discuss any questions you have with your health care provider. Document Revised: 06/18/2018 Document Reviewed: 05/09/2017 Elsevier Patient Education  2020 [employer].        First Trimester of Pregnancy The first trimester of pregnancy is from week 1 until  the end of week 13 (months 1 through 3). A week after a sperm fertilizes an egg, the egg will implant on the wall of the uterus. This embryo will begin to develop into a baby. Genes from you and your partner will  form the baby. The female genes will determine whether the baby will be a boy or a girl. At 6-8 weeks, the eyes and face will be formed, and the heartbeat can be seen on ultrasound. At the end of 12 weeks, all the baby's organs will be formed. Now that you are pregnant, you will want to do everything you can to have a healthy baby. Two of the most important things are to get good prenatal care and to follow your health care provider's instructions. Prenatal care is all the medical care you receive before the baby's birth. This care will help prevent, find, and treat any problems during the pregnancy and childbirth. Body changes during your first trimester Your body goes through many changes during pregnancy. The changes vary from woman to woman.  You may gain or lose a couple of pounds at first.  You may feel sick to your stomach (nauseous) and you may throw up (vomit). If the vomiting is uncontrollable, call your health care provider.  You may tire easily.  You may develop headaches that can be relieved by medicines. All medicines should be approved by your health care provider.  You may urinate more often. Painful urination may mean you have a bladder infection.  You may develop heartburn as a result of your pregnancy.  You may develop constipation because certain hormones are causing the muscles that push stool through your intestines to slow down.  You may develop hemorrhoids or swollen veins (varicose veins).  Your breasts may begin to grow larger and become tender. Your nipples may stick out more, and the tissue that surrounds them (areola) may become darker.  Your gums may bleed and may be sensitive to brushing and flossing.  Dark spots or blotches (chloasma, mask of pregnancy)  may develop on your face. This will likely fade after the baby is born.  Your menstrual periods will stop.  You may have a loss of appetite.  You may develop cravings for certain kinds of food.  You may have changes in your emotions from day to day, such as being excited to be pregnant or being concerned that something may go wrong with the pregnancy and baby.  You may have more vivid and strange dreams.  You may have changes in your hair. These can include thickening of your hair, rapid growth, and changes in texture. Some women also have hair loss during or after pregnancy, or hair that feels dry or thin. Your hair will most likely return to normal after your baby is born. What to expect at prenatal visits During a routine prenatal visit:  You will be weighed to make sure you and the baby are growing normally.  Your blood pressure will be taken.  Your abdomen will be measured to track your baby's growth.  The fetal heartbeat will be listened to between weeks 10 and 14 of your pregnancy.  Test results from any previous visits will be discussed. Your health care provider may ask you:  How you are feeling.  If you are feeling the baby move.  If you have had any abnormal symptoms, such as leaking fluid, bleeding, severe headaches, or abdominal cramping.  If you are using any tobacco products, including cigarettes, chewing tobacco, and electronic cigarettes.  If you have any questions. Other tests that may be performed during your first trimester include:  Blood tests to find your blood type and to check for the presence of any previous infections. The tests will also be used  to check for low iron levels (anemia) and protein on red blood cells (Rh antibodies). Depending on your risk factors, or if you previously had diabetes during pregnancy, you may have tests to check for high blood sugar that affects pregnant women (gestational diabetes).  Urine tests to check for infections,  diabetes, or protein in the urine.  An ultrasound to confirm the proper growth and development of the baby.  Fetal screens for spinal cord problems (spina bifida) and Down syndrome.  HIV (human immunodeficiency virus) testing. Routine prenatal testing includes screening for HIV, unless you choose not to have this test.  You may need other tests to make sure you and the baby are doing well. Follow these instructions at home: Medicines  Follow your health care provider's instructions regarding medicine use. Specific medicines may be either safe or unsafe to take during pregnancy.  Take a prenatal vitamin that contains at least 600 micrograms (mcg) of folic acid.  If you develop constipation, try taking a stool softener if your health care provider approves. Eating and drinking   Eat a balanced diet that includes fresh fruits and vegetables, whole grains, good sources of protein such as meat, eggs, or tofu, and low-fat dairy. Your health care provider will help you determine the amount of weight gain that is right for you.  Avoid raw meat and uncooked cheese. These carry germs that can cause birth defects in the baby.  Eating four or five small meals rather than three large meals a day may help relieve nausea and vomiting. If you start to feel nauseous, eating a few soda crackers can be helpful. Drinking liquids between meals, instead of during meals, also seems to help ease nausea and vomiting.  Limit foods that are high in fat and processed sugars, such as fried and sweet foods.  To prevent constipation: ? Eat foods that are high in fiber, such as fresh fruits and vegetables, whole grains, and beans. ? Drink enough fluid to keep your urine clear or pale yellow. Activity  Exercise only as directed by your health care provider. Most women can continue their usual exercise routine during pregnancy. Try to exercise for 30 minutes at least 5 days a week. Exercising will help  you: ? Control your weight. ? Stay in shape. ? Be prepared for labor and delivery.  Experiencing pain or cramping in the lower abdomen or lower back is a good sign that you should stop exercising. Check with your health care provider before continuing with normal exercises.  Try to avoid standing for long periods of time. Move your legs often if you must stand in one place for a long time.  Avoid heavy lifting.  Wear low-heeled shoes and practice good posture.  You may continue to have sex unless your health care provider tells you not to. Relieving pain and discomfort  Wear a good support bra to relieve breast tenderness.  Take warm sitz baths to soothe any pain or discomfort caused by hemorrhoids. Use hemorrhoid cream if your health care provider approves.  Rest with your legs elevated if you have leg cramps or low back pain.  If you develop varicose veins in your legs, wear support hose. Elevate your feet for 15 minutes, 3-4 times a day. Limit salt in your diet. Prenatal care  Schedule your prenatal visits by the twelfth week of pregnancy. They are usually scheduled monthly at first, then more often in the last 2 months before delivery.  Write down your questions. Take them  to your prenatal visits.  Keep all your prenatal visits as told by your health care provider. This is important. Safety  Wear your seat belt at all times when driving.  Make a list of emergency phone numbers, including numbers for family, friends, the hospital, and police and fire departments. General instructions  Ask your health care provider for a referral to a local prenatal education class. Begin classes no later than the beginning of month 6 of your pregnancy.  Ask for help if you have counseling or nutritional needs during pregnancy. Your health care provider can offer advice or refer you to specialists for help with various needs.  Do not use hot tubs, steam rooms, or saunas.  Do not douche or  use tampons or scented sanitary pads.  Do not cross your legs for long periods of time.  Avoid cat litter boxes and soil used by cats. These carry germs that can cause birth defects in the baby and possibly loss of the fetus by miscarriage or stillbirth.  Avoid all smoking, herbs, alcohol, and medicines not prescribed by your health care provider. Chemicals in these products affect the formation and growth of the baby.  Do not use any products that contain nicotine or tobacco, such as cigarettes and e-cigarettes. If you need help quitting, ask your health care provider. You may receive counseling support and other resources to help you quit.  Schedule a dentist appointment. At home, brush your teeth with a soft toothbrush and be gentle when you floss. Contact a health care provider if:  You have dizziness.  You have mild pelvic cramps, pelvic pressure, or nagging pain in the abdominal area.  You have persistent nausea, vomiting, or diarrhea.  You have a bad smelling vaginal discharge.  You have pain when you urinate.  You notice increased swelling in your face, hands, legs, or ankles.  You are exposed to fifth disease or chickenpox.  You are exposed to [place] measles (rubella) and have never had it. Get help right away if:  You have a fever.  You are leaking fluid from your vagina.  You have spotting or bleeding from your vagina.  You have severe abdominal cramping or pain.  You have rapid weight gain or loss.  You vomit blood or material that looks like coffee grounds.  You develop a severe headache.  You have shortness of breath.  You have any kind of trauma, such as from a fall or a car accident. Summary  The first trimester of pregnancy is from week 1 until the end of week 13 (months 1 through 3).  Your body goes through many changes during pregnancy. The changes vary from woman to woman.  You will have routine prenatal visits. During those visits, your health  care provider will examine you, discuss any test results you may have, and talk with you about how you are feeling. This information is not intended to replace advice given to you by your health care provider. Make sure you discuss any questions you have with your health care provider. Document Revised: 02/08/2017 Document Reviewed: 02/08/2016 Elsevier Patient Education  2020 Elsevier Inc.        Vaginal Bleeding During Pregnancy, First Trimester  A small amount of bleeding from the vagina (spotting) is relatively common during early pregnancy. It usually stops on its own. Various things may cause bleeding or spotting during early pregnancy. Some bleeding may be related to the pregnancy, and some may not. In many cases, the bleeding is  normal and is not a problem. However, bleeding can also be a sign of something serious. Be sure to tell your health care provider about any vaginal bleeding right away. Some possible causes of vaginal bleeding during the first trimester include:  Infection or inflammation of the cervix.  Growths (polyps) on the cervix.  Miscarriage or threatened miscarriage.  Pregnancy tissue developing outside of the uterus (ectopic pregnancy).  A mass of tissue developing in the uterus due to an egg being fertilized incorrectly (molar pregnancy). Follow these instructions at home: Activity  Follow instructions from your health care provider about limiting your activity. Ask what activities are safe for you.  If needed, make plans for someone to help with your regular activities.  Do not have sex or orgasms until your health care provider says that this is safe. General instructions  Take over-the-counter and prescription medicines only as told by your health care provider.  Pay attention to any changes in your symptoms.  Do not use tampons or douche.  Write down how many pads you use each day, how often you change pads, and how soaked (saturated) they  are.  If you pass any tissue from your vagina, save the tissue so you can show it to your health care provider.  Keep all follow-up visits as told by your health care provider. This is important. Contact a health care provider if:  You have vaginal bleeding during any part of your pregnancy.  You have cramps or labor pains.  You have a fever. Get help right away if:  You have severe cramps in your back or abdomen.  You pass large clots or a large amount of tissue from your vagina.  Your bleeding increases.  You feel light-headed or weak, or you faint.  You have chills.  You are leaking fluid or have a gush of fluid from your vagina. Summary  A small amount of bleeding (spotting) from the vagina is relatively common during early pregnancy.  Various things may cause bleeding or spotting in early pregnancy.  Be sure to tell your health care provider about any vaginal bleeding right away. This information is not intended to replace advice given to you by your health care provider. Make sure you discuss any questions you have with your health care provider. Document Revised: 06/17/2018 Document Reviewed: 05/31/2016 Elsevier Patient Education  2020 [employer].

## 2019-11-02 NOTE — Telephone Encounter (Signed)
Spoke to patient. She is at [facility] hospital now, awaiting ultrasound.

## 2019-11-02 NOTE — MAU Note (Signed)
Started spotting on Sat, had gotten heavier, then stopped, has restarted and she passed a couple quarter sized clots this morning. Had some cramping on Sat, none since then

## 2019-11-02 NOTE — MAU Provider Note (Signed)
History     CSN: [id number]  Arrival date and time: 11/02/19 1205   First Provider Initiated Contact with Patient 11/02/19 1252      Chief Complaint  Patient presents with  . Vaginal Bleeding   Ms. Carrie Sheppard is a 34 y.o. G3P0020 at [redacted]w[redacted]d who presents to MAU for vaginal bleeding which began Saturday. Patient reports the bleeding started out as light spotting, but then turned in to a steady flow that was lighter than a normal period. Patient reports the bleeding did need a pad on Saturday. Patient reports yesterday it was spotting only and today it has been heavier than spotting, but less than a period, with "a couple of clots" that were about half dollar size. Patient reports the bleeding is bright red. Pt reports since she has been to MAU she has not been experiencing bleeding. Patient has not yet had an ultrasound this pregnancy. Patient is sure of LMP.  Passing blood clots? Per above Blood soaking clothes? no Lightheaded/dizzy? no Significant pelvic pain or cramping? Pt reports she was cramping Saturday only Passed any tissue? no Hx ectopic pregnancy? no  Current pregnancy problems? Pt has not yet been seen Blood Type? unknown Allergies? NKDA Current medications? none Current PNC & next appt? Pt requests list of OB providers  Pt denies vaginal discharge/odor/itching. Pt denies N/V, abdominal pain, constipation, diarrhea, or urinary problems. Pt denies fever, chills, fatigue, sweating or changes in appetite. Pt denies SOB or chest pain. Pt denies dizziness, HA, light-headedness, weakness.   OB History    Gravida  3   Para      Term      Preterm      AB  2   Living        SAB      TAB  2   Ectopic      Multiple      Live Births              Past Medical History:  Diagnosis Date  . Medical history non-contributory   . Tobacco abuse 12/12/2018    Past Surgical History:  Procedure Laterality Date  . NO PAST SURGERIES      Family History   Problem Relation Age of Onset  . Diabetes Brother        1/2 brother  . Diabetes Sister        1/2 sister    Social History   Tobacco Use  . Smoking status: Former Smoker    Packs/day: 0.50    Years: 15.00    Pack years: 7.50    Types: Cigarettes  . Smokeless tobacco: Never Used  . Tobacco comment: quit 3 weeks ago  Vaping Use  . Vaping Use: Never used  Substance Use Topics  . Alcohol use: Not Currently    Comment: wine almost every day  . Drug use: Not Currently    Allergies: No Known Allergies  Medications Prior to Admission  Medication Sig Dispense Refill Last Dose  . Prenatal Vit-Fe Fumarate-FA (PRENATAL VITAMIN) 27-0.8 MG TABS Take 1 tablet by mouth daily. 30 tablet      Review of Systems  Constitutional: Negative for chills, diaphoresis, fatigue and fever.  Eyes: Negative for visual disturbance.  Respiratory: Negative for shortness of breath.   Cardiovascular: Negative for chest pain.  Gastrointestinal: Negative for abdominal pain, constipation, diarrhea, nausea and vomiting.  Genitourinary: Positive for pelvic pain (cramping only, not present at this time) and vaginal bleeding (earlier today, not present  at this time). Negative for dysuria, flank pain, frequency, urgency and vaginal discharge.  Neurological: Negative for dizziness, weakness, light-headedness and headaches.   Physical Exam   Blood pressure 126/81, pulse 88, temperature 98.9 F (37.2 C), resp. rate 18, height 5\' 2"  (1.575 m), weight 55.7 kg, last menstrual period 09/17/2019, SpO2 99 %.  Patient Vitals for the past 24 hrs:  BP Temp Pulse Resp SpO2 Height Weight  11/02/19 1248 126/81 -- 88 -- -- -- --  11/02/19 1226 (!) 153/92 98.9 F (37.2 C) (!) 108 18 99 % 5\' 2"  (1.575 m) 55.7 kg    Physical Exam Vitals and nursing note reviewed.  Constitutional:      General: She is not in acute distress.    Appearance: Normal appearance. She is normal weight. She is not ill-appearing, toxic-appearing  or diaphoretic.  HENT:     Head: Normocephalic and atraumatic.  Pulmonary:     Effort: Pulmonary effort is normal.  Neurological:     Mental Status: She is alert and oriented to person, place, and time.  Psychiatric:        Mood and Affect: Mood normal.        Behavior: Behavior normal.        Thought Content: Thought content normal.        Judgment: Judgment normal.    Results for orders placed or performed during the hospital encounter of 11/02/19 (from the past 24 hour(s))  Urinalysis, Routine w reflex microscopic Urine, Clean Catch     Status: Abnormal   Collection Time: 11/02/19 12:51 PM  Result Value Ref Range   Color, Urine COLORLESS (A) YELLOW   APPearance CLEAR CLEAR   Specific Gravity, Urine 1.002 (L) 1.005 - 1.030   pH 6.0 5.0 - 8.0   Glucose, UA NEGATIVE NEGATIVE mg/dL   Hgb urine dipstick LARGE (A) NEGATIVE   Bilirubin Urine NEGATIVE NEGATIVE   Ketones, ur NEGATIVE NEGATIVE mg/dL   Protein, ur NEGATIVE NEGATIVE mg/dL   Nitrite NEGATIVE NEGATIVE   Leukocytes,Ua NEGATIVE NEGATIVE   WBC, UA 0-5 0 - 5 WBC/hpf   Bacteria, UA RARE (A) NONE SEEN   Squamous Epithelial / LPF 0-5 0 - 5  CBC     Status: None   Collection Time: 11/02/19  1:08 PM  Result Value Ref Range   WBC 7.0 4.0 - 10.5 K/uL   RBC 4.57 3.87 - 5.11 MIL/uL   Hemoglobin 13.1 12.0 - 15.0 g/dL   HCT 11/04/19 36 - 46 %   MCV 86.2 80.0 - 100.0 fL   MCH 28.7 26.0 - 34.0 pg   MCHC 33.2 30.0 - 36.0 g/dL   RDW 11/04/19 50.9 - 32.6 %   Platelets 311 150 - 400 K/uL   nRBC 0.0 0.0 - 0.2 %  Comprehensive metabolic panel     Status: Abnormal   Collection Time: 11/02/19  1:08 PM  Result Value Ref Range   Sodium 140 135 - 145 mmol/L   Potassium 3.7 3.5 - 5.1 mmol/L   Chloride 108 98 - 111 mmol/L   CO2 24 22 - 32 mmol/L   Glucose, Bld 103 (H) 70 - 99 mg/dL   BUN 8 6 - 20 mg/dL   Creatinine, Ser 45.8 0.44 - 1.00 mg/dL   Calcium 9.2 8.9 - 11/04/19 mg/dL   Total Protein 7.5 6.5 - 8.1 g/dL   Albumin 4.5 3.5 - 5.0 g/dL    AST 12 (L) 15 - 41 U/L   ALT 11 0 -  44 U/L   Alkaline Phosphatase 52 38 - 126 U/L   Total Bilirubin 0.6 0.3 - 1.2 mg/dL   GFR calc non Af Amer >60 >60 mL/min   GFR calc Af Amer >60 >60 mL/min   Anion gap 8 5 - 15  hCG, quantitative, pregnancy     Status: Abnormal   Collection Time: 11/02/19  1:08 PM  Result Value Ref Range   hCG, Beta Chain, Quant, S 179 (H) <5 mIU/mL  ABO/Rh     Status: None   Collection Time: 11/02/19  1:08 PM  Result Value Ref Range   ABO/RH(D) A POS    No rh immune globuloin      NOT A RH IMMUNE GLOBULIN CANDIDATE, PT RH POSITIVE Performed at [facility], [address] [place], [place] [address]   Wet prep, genital     Status: Abnormal   Collection Time: 11/02/19  1:30 PM   Specimen: Vaginal  Result Value Ref Range   Yeast Wet Prep HPF POC NONE SEEN NONE SEEN   Trich, Wet Prep NONE SEEN NONE SEEN   Clue Cells Wet Prep HPF POC PRESENT (A) NONE SEEN   WBC, Wet Prep HPF POC MANY (A) NONE SEEN   Sperm NONE SEEN    US OB LESS THAN 14 WEEKS WITH OB TRANSVAGINAL  Result Date: 11/02/2019 CLINICAL DATA:  First trimester of pregnancy, vaginal bleeding. EXAM: OBSTETRIC <14 WK [place] AND TRANSVAGINAL OB US TECHNIQUE: Both transabdominal and transvaginal ultrasound examinations were performed for complete evaluation of the gestation as well as the maternal uterus, adnexal regions, and pelvic cul-de-sac. Transvaginal technique was performed to assess early pregnancy. COMPARISON:  None. FINDINGS: Intrauterine gestational sac: None Yolk sac:  Not Visualized. Embryo:  Not Visualized. Cardiac Activity: Not Visualized. Subchorionic hemorrhage:  None visualized. Maternal uterus/adnexae: Ovaries are unremarkable. Trace free fluid is noted which most likely is physiologic. IMPRESSION: No intrauterine gestational sac, yolk sac, fetal pole, or cardiac activity visualized. Differential considerations include intrauterine gestation too early to be sonographically visualized,  spontaneous abortion, or ectopic pregnancy. Consider follow-up ultrasound in 14 days and serial quantitative beta HCG follow-up. Electronically Signed   By: Lupita Raider M.D.   On: 11/02/2019 16:16    MAU Course  Procedures  MDM -r/o ectopic -UA: colorless/SG1.002/lg hgb/rare bacteria, sending urine for culture -CBC: WNL -CMP: GLU 103, AST 12 -[place]: PUL -hCG: 179 -ABO: A Positive -WetPrep: +ClueCells (isolated finding not requiring treatment) -GC/CT collected -pt discharged to home in stable condition  Orders Placed This Encounter  Procedures  . Wet prep, genital    Standing Status:   Standing    Number of Occurrences:   1  . Culture, OB Urine    Standing Status:   Standing    Number of Occurrences:   1  . US OB LESS THAN 14 WEEKS WITH OB TRANSVAGINAL    Standing Status:   Standing    Number of Occurrences:   1    Order Specific Question:   Symptom/Reason for Exam    Answer:   Vaginal bleeding in pregnancy [[id number]]  . Urinalysis, Routine w reflex microscopic Urine, Clean Catch    Standing Status:   Standing    Number of Occurrences:   1  . CBC    Standing Status:   Standing    Number of Occurrences:   1  . Comprehensive metabolic panel    Standing Status:   Standing    Number of Occurrences:  1  . hCG, quantitative, pregnancy    Standing Status:   Standing    Number of Occurrences:   1  . ABO/Rh    Standing Status:   Standing    Number of Occurrences:   1  . Discharge patient    Order Specific Question:   Discharge disposition    Answer:   01-Home or Self Care [1]    Order Specific Question:   Discharge patient date    Answer:   11/02/2019   No orders of the defined types were placed in this encounter.   Assessment and Plan   1. Pregnancy of unknown anatomic location   2. Vaginal bleeding in pregnancy   3. Blood type, Rh positive     Allergies as of 11/02/2019   No Known Allergies     Medication List    TAKE these medications   Prenatal Vitamin  27-0.8 MG Tabs Take 1 tablet by mouth daily.       -will call with culture results, if positive -discussed ectopic vs. SAB vs. miscarriage -strict ectopic precautions given -return MAU precautions -[facility]2019 [facility]15[facility] -pt discharged to home in stable condition  Joni Reiningicole E Rik Wadel 11/02/2019, 6:02 PM

## 2019-11-03 LAB — GC/CHLAMYDIA PROBE AMP (~~LOC~~) NOT AT ARMC
Chlamydia: NEGATIVE
Comment: NEGATIVE
Comment: NORMAL
Neisseria Gonorrhea: NEGATIVE

## 2019-11-04 LAB — CULTURE, OB URINE

## 2019-11-05 ENCOUNTER — Other Ambulatory Visit: Payer: Self-pay

## 2019-11-05 ENCOUNTER — Other Ambulatory Visit: Payer: 59

## 2019-11-05 DIAGNOSIS — O4690 Antepartum hemorrhage, unspecified, unspecified trimester: Secondary | ICD-10-CM

## 2019-11-05 NOTE — Progress Notes (Signed)
Patient presents for HCG follow up from MAU. Patient sent to lab. Armandina Stammer RN

## 2019-11-06 LAB — BETA HCG QUANT (REF LAB): hCG Quant: 35 m[IU]/mL

## 2019-11-06 NOTE — Progress Notes (Signed)
Called patient with results. No further bleeding. Patient to follow up if no menses in 6-8 weeks. Recommended continuing with PNV and waiting 3 months prior to trying to get pregnant again.

## 2020-02-02 ENCOUNTER — Ambulatory Visit (INDEPENDENT_AMBULATORY_CARE_PROVIDER_SITE_OTHER): Payer: 59 | Admitting: Family

## 2020-02-02 ENCOUNTER — Other Ambulatory Visit: Payer: Self-pay

## 2020-02-02 VITALS — BP 125/80 | HR 87 | Temp 98.7°F | Resp 16 | Ht 62.0 in | Wt 129.0 lb

## 2020-02-02 DIAGNOSIS — Z3200 Encounter for pregnancy test, result unknown: Secondary | ICD-10-CM | POA: Diagnosis not present

## 2020-02-02 DIAGNOSIS — Z23 Encounter for immunization: Secondary | ICD-10-CM | POA: Diagnosis not present

## 2020-02-02 DIAGNOSIS — Z3201 Encounter for pregnancy test, result positive: Secondary | ICD-10-CM | POA: Diagnosis not present

## 2020-02-02 LAB — POCT URINE PREGNANCY: Preg Test, Ur: POSITIVE — AB

## 2020-02-02 NOTE — Progress Notes (Signed)
Subjective:    Patient ID: Carrie Sheppard, female    DOB: 02/03/1986, 34 y.o.   MRN: [id number]  HPI  Patient is a 34 yr old female who presents today to confirm 2 positive urine pregnancy test performed at home.  She had a miscarriage on 11/02/19.   LMP 01/02/20, on a prenatal.  States she is having constipation, fatigue, breast tenderness.    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  Tobacco Use   Smoking status: Former Smoker    Packs/day: 0.50    Years: 15.00    Pack years: 7.50    Types: Cigarettes   Smokeless tobacco: Never Used   Tobacco comment: quit 3 weeks ago  Vaping Use   Vaping Use: Never used  Substance and Sexual Activity   Alcohol use: Not Currently    Comment: wine almost every day   Drug use: Not Currently   Sexual activity: Yes    Partners: Male    Birth control/protection: None  Other Topics Concern   Not on file  Social History Narrative   Works in the Visual merchandiser   Moved to [place] from [place]   No children   Has a dog   Enjoys netflix, some hiking    Social Determinants of Corporate investment banker Strain:    Difficulty of Paying Living Expenses: Not on file  Food Insecurity:    Worried About Programme researcher, broadcasting/film/video in the Last Year: Not on file   [employer] of Food in the Last Year: Not on file  Transportation Needs:    Lack of Transportation (Medical): Not on file   Lack of Transportation (Non-Medical): Not on file  Physical Activity:    Days of Exercise per Week: Not on file   Minutes of Exercise per Session: Not on file  Stress:      Feeling of Stress : Not on file  Social Connections:    Frequency of Communication with Friends and Family: Not on file   Frequency of Social Gatherings with Friends and Family: Not on file   Attends Religious Services: Not on file   Active Member of Clubs or Organizations: Not on file   Attends Banker Meetings: Not on file   Marital Status: Not on file  Intimate Partner Violence:    Fear of Current or Ex-Partner: Not on file   Emotionally Abused: Not on file   Physically Abused: Not on file   Sexually Abused: Not on file    Past Surgical History:  Procedure Laterality Date   NO PAST SURGERIES      Family History  Problem Relation Age of Onset   Diabetes Brother        1/2 brother   Diabetes Sister        1/2 sister    No Known Allergies  Current Outpatient Medications on File Prior to Visit  Medication Sig Dispense Refill   Prenatal Vit-Fe Fumarate-FA (PRENATAL VITAMIN) 27-0.8 MG TABS Take 1 tablet by mouth daily. 30 tablet  No current facility-administered medications on file prior to visit.    BP 125/80 (BP Location: Right Arm, Patient Position: Sitting, Cuff Size: Small)    Pulse 87    Temp 98.7 F (37.1 C) (Oral)    Resp 16    Ht 5\' 2"  (1.575 m)    Wt 129 lb (58.5 kg)    LMP 01/02/2020    SpO2 100%    BMI 23.59 kg/m    Objective:   Physical Exam Constitutional:      Appearance: Normal appearance. She is not ill-appearing.  Neurological:     Mental Status: She is alert.  Psychiatric:        Attention and Perception: Attention normal.        Mood and Affect: Mood normal.        Speech: Speech normal.        Behavior: Behavior normal.        Cognition and Memory: Cognition normal.           Assessment & Plan:  Positive pregnancy test- she is happy about these results but remains guarded given her recent miscarriage.  Will refer to OB/GYN for prenatal care. She will continue her prenatal vitamin. She is familiar with  safety/nutritional recommendations for pregnancy.   Flu shot today.  This visit occurred during the SARS-CoV-2 public health emergency.  Safety protocols were in place, including screening questions prior to the visit, additional usage of staff PPE, and extensive cleaning of exam room while observing appropriate contact time as indicated for disinfecting solutions.

## 2020-02-02 NOTE — Patient Instructions (Signed)
You should be contacted about scheduling your appointment with OB/GYN.  Continue your prenatal vitamin.

## 2020-02-17 ENCOUNTER — Other Ambulatory Visit (HOSPITAL_COMMUNITY)
Admission: RE | Admit: 2020-02-17 | Discharge: 2020-02-17 | Disposition: A | Discharge status: Home / Self Care | Payer: 59 | Source: Ambulatory Visit | Attending: Family Medicine | Admitting: Family Medicine

## 2020-02-17 ENCOUNTER — Encounter: Payer: Self-pay | Admitting: Family Medicine

## 2020-02-17 ENCOUNTER — Ambulatory Visit (INDEPENDENT_AMBULATORY_CARE_PROVIDER_SITE_OTHER): Payer: 59 | Admitting: Family Medicine

## 2020-02-17 ENCOUNTER — Other Ambulatory Visit: Payer: Self-pay

## 2020-02-17 DIAGNOSIS — Z3480 Encounter for supervision of other normal pregnancy, unspecified trimester: Secondary | ICD-10-CM | POA: Diagnosis present

## 2020-02-17 NOTE — Progress Notes (Signed)
  Subjective:  Carrie Sheppard is a [id number] [redacted]w[redacted]d being seen today for her first obstetrical visit.  Her obstetrical history is significant for previous miscarriage. Patient does intend to breast feed. Pregnancy history fully reviewed.  Patient reports nausea.  BP 114/70   Pulse 82   Wt 131 lb (59.4 kg)   LMP 01/02/2020   Breastfeeding Unknown   BMI 23.96 kg/m   HISTORY: OB History  Gravida Para Term Preterm AB Living  4       3    SAB TAB Ectopic Multiple Live Births  1 2          # Outcome Date GA Lbr Len/2nd Weight Sex Delivery Anes PTL Lv  4 Current           3 SAB 10/2019          2 TAB           1 TAB             Past Medical History:  Diagnosis Date  . Medical history non-contributory   . Tobacco abuse 12/12/2018    Past Surgical History:  Procedure Laterality Date  . NO PAST SURGERIES      Family History  Problem Relation Age of Onset  . Diabetes Brother        1/2 brother  . Diabetes Sister        1/2 sister     Exam  BP 114/70   Pulse 82   Wt 131 lb (59.4 kg)   LMP 01/02/2020   Breastfeeding Unknown   BMI 23.96 kg/m   Chaperone present during exam  CONSTITUTIONAL: Well-developed, well-nourished female in no acute distress.  HENT:  Normocephalic, atraumatic, External right and left ear normal. Oropharynx is clear and moist EYES: Conjunctivae and EOM are normal. Pupils are equal, round, and reactive to light. No scleral icterus.  NECK: Normal range of motion, supple, no masses.  Normal thyroid.  CARDIOVASCULAR: Normal heart rate noted, regular rhythm RESPIRATORY: Clear to auscultation bilaterally. Effort and breath sounds normal, no problems with respiration noted. BREASTS: Symmetric in size. No masses, skin changes, nipple drainage, or lymphadenopathy. ABDOMEN: Soft, normal bowel sounds, no distention noted.  No tenderness, rebound or guarding.  PELVIC: Normal appearing external genitalia; normal appearing vaginal mucosa and cervix. No abnormal  discharge noted. Normal uterine size, no other palpable masses, no uterine or adnexal tenderness. MUSCULOSKELETAL: Normal range of motion. No tenderness.  No cyanosis, clubbing, or edema.  2+ distal pulses. SKIN: Skin is warm and dry. No rash noted. Not diaphoretic. No erythema. No pallor. NEUROLOGIC: Alert and oriented to person, place, and time. Normal reflexes, muscle tone coordination. No cranial nerve deficit noted. PSYCHIATRIC: Normal mood and affect. Normal behavior. Normal judgment and thought content.    Assessment:    Pregnancy: [id number] Patient Active Problem List   Diagnosis Date Noted  . Supervision of other normal pregnancy, antepartum 02/17/2020      Plan:   1. Supervision of other normal pregnancy, antepartum Discussed practice, delivery at [facility] at [facility].  Desires Panorama Nausea mild and tolerable, will call with problems Discussed things to avoid Prenatal packet given - CBC/D/Plt+RPR+Rh+ABO+Rub Ab... - Culture, OB Urine - GC/Chlamydia probe amp ([place])not at [facility] - [facility] AMB BABYSCRIPTS OPT IN     Problem list reviewed and updated. 75% of 30 min visit spent on counseling and coordination of care.     Levie Heritage 02/17/2020

## 2020-02-18 LAB — CBC/D/PLT+RPR+RH+ABO+RUB AB...
Antibody Screen: NEGATIVE
Basophils Absolute: 0.1 10*3/uL (ref 0.0–0.2)
Basos: 1 %
EOS (ABSOLUTE): 0.2 10*3/uL (ref 0.0–0.4)
Eos: 2 %
HCV Ab: <0.1 s/co ratio (ref 0.0–0.9)
HIV Screen 4th Generation wRfx: NONREACTIVE
Hematocrit: 38.5 % (ref 34.0–46.6)
Hemoglobin: 12.7 g/dL (ref 11.1–15.9)
Hepatitis B Surface Ag: NEGATIVE
Immature Grans (Abs): 0 10*3/uL (ref 0.0–0.1)
Immature Granulocytes: 0 %
Lymphocytes Absolute: 2.9 10*3/uL (ref 0.7–3.1)
Lymphs: 37 %
MCH: 28.5 pg (ref 26.6–33.0)
MCHC: 33 g/dL (ref 31.5–35.7)
MCV: 87 fL (ref 79–97)
Monocytes Absolute: 0.5 10*3/uL (ref 0.1–0.9)
Monocytes: 7 %
Neutrophils Absolute: 4.2 10*3/uL (ref 1.4–7.0)
Neutrophils: 53 %
Platelets: 347 10*3/uL (ref 150–450)
RBC: 4.45 x10E6/uL (ref 3.77–5.28)
RDW: 12.1 % (ref 11.7–15.4)
RPR Ser Ql: NONREACTIVE
Rh Factor: POSITIVE
Rubella Antibodies, IGG: 6.61 index (ref >0.99)
WBC: 7.8 10*3/uL (ref 3.4–10.8)

## 2020-02-18 LAB — HCV INTERPRETATION

## 2020-02-19 LAB — GC/CHLAMYDIA PROBE AMP (~~LOC~~) NOT AT ARMC
Chlamydia: NEGATIVE
Comment: NEGATIVE
Comment: NORMAL
Neisseria Gonorrhea: NEGATIVE

## 2020-02-19 LAB — CULTURE, OB URINE

## 2020-02-19 LAB — URINE CULTURE, OB REFLEX: Organism ID, Bacteria: NO GROWTH

## 2020-03-12 NOTE — L&D Delivery Note (Addendum)
OB/GYN Faculty Practice Delivery Note  Carrie Sheppard is a 35 y.o. G4P0030 s/p SVD at [redacted]w[redacted]d. She was admitted for PROM requiring IOL.   ROM: 26h 54m with clear fluid GBS Status: Negative Maximum Maternal Temperature: 102.1  Labor Progress: PROM at home and admitted for expectant management. Patient did not make change spontaneously requiring cytotec x2 before progressing to complete  Delivery Date/Time: 10/09/2020 @0153  Delivery: Called to room and patient was complete and pushing. Per patient request FOB co-delivered in sterile gloves. Head delivered ROA. No nuchal cord present. Shoulder and body delivered in usual fashion. Carrie Sheppard with spontaneous cry, placed on mother's abdomen, dried and stimulated. Cord clamped x 2 after 1-minute delay, and cut by FOB. Cord blood drawn. Placenta delivered spontaneously with gentle cord traction. Fundus firm with massage and Pitocin. Labia, perineum, vagina, and cervix inspected inspected with 3c laceration. Dr paged to room for repair.   Placenta: Intact, 3 vessel Complications: none Lacerations: 3c laceration EBL: 150 Analgesia: Epidural, lidocaine  Postpartum Planning [X]  message to sent to schedule follow-up    Carrie Sheppard: Viable female  APGARs 9/9  3050g  MD 10/09/2020, 3:49 AM    I was gloved and present for entire delivery SVD without incident No difficulty with shoulders No lacerations Lacerations as listed above Repair of same supervised by me  Nelson Chimes, CNM 10/09/20 6:30 AM

## 2020-03-16 ENCOUNTER — Ambulatory Visit (INDEPENDENT_AMBULATORY_CARE_PROVIDER_SITE_OTHER): Payer: 59 | Admitting: Family Medicine

## 2020-03-16 ENCOUNTER — Encounter: Payer: Self-pay | Admitting: Family Medicine

## 2020-03-16 ENCOUNTER — Other Ambulatory Visit: Payer: Self-pay

## 2020-03-16 VITALS — BP 122/71 | HR 75 | Wt 130.0 lb

## 2020-03-16 DIAGNOSIS — Z3480 Encounter for supervision of other normal pregnancy, unspecified trimester: Secondary | ICD-10-CM

## 2020-03-16 DIAGNOSIS — Z3A10 10 weeks gestation of pregnancy: Secondary | ICD-10-CM

## 2020-03-16 NOTE — Progress Notes (Signed)
   PRENATAL VISIT NOTE  Subjective:  Carrie Sheppard is a 35 y.o. G4P0030 at [redacted]w[redacted]d being seen today for ongoing prenatal care.  She is currently monitored for the following issues for this low-risk pregnancy and has Supervision of other normal pregnancy, antepartum on their problem list.  Patient reports nausea.  Contractions: Not present. Vag. Bleeding: None.  Movement: Absent. Denies leaking of fluid.   The following portions of the patient's history were reviewed and updated as appropriate: allergies, current medications, past family history, past medical history, past social history, past surgical history and problem list.   Objective:   Vitals:   03/16/20 1556  BP: 122/71  Pulse: 75  Weight: 130 lb (59 kg)    Fetal Status: Fetal Heart Rate (bpm): 168   Movement: Absent     General:  Alert, oriented and cooperative. Patient is in no acute distress.  Skin: Skin is warm and dry. No rash noted.   Cardiovascular: Normal heart rate noted  Respiratory: Normal respiratory effort, no problems with respiration noted  Abdomen: Soft, gravid, appropriate for gestational age.  Pain/Pressure: Absent     Pelvic: Cervical exam deferred        Extremities: Normal range of motion.  Edema: None  Mental Status: Normal mood and affect. Normal behavior. Normal judgment and thought content.   Assessment and Plan:  Pregnancy: G4P0030 at [redacted]w[redacted]d 1. [redacted] weeks gestation of pregnancy  2. Supervision of other normal pregnancy, antepartum FHT normal. Panorama today  Preterm labor symptoms and general obstetric precautions including but not limited to vaginal bleeding, contractions, leaking of fluid and fetal movement were reviewed in detail with the patient. Please refer to After Visit Summary for other counseling recommendations.   Return in about 4 weeks (around 04/13/2020) for OB f/u, virtual.  No future appointments.  Levie Heritage, DO

## 2020-04-13 ENCOUNTER — Telehealth (INDEPENDENT_AMBULATORY_CARE_PROVIDER_SITE_OTHER): Payer: 59 | Admitting: Family Medicine

## 2020-04-13 DIAGNOSIS — Z3480 Encounter for supervision of other normal pregnancy, unspecified trimester: Secondary | ICD-10-CM

## 2020-04-13 DIAGNOSIS — Z3482 Encounter for supervision of other normal pregnancy, second trimester: Secondary | ICD-10-CM

## 2020-04-13 DIAGNOSIS — Z3A14 14 weeks gestation of pregnancy: Secondary | ICD-10-CM

## 2020-04-13 NOTE — Progress Notes (Signed)
   OBSTETRICS PRENATAL VIRTUAL VISIT ENCOUNTER NOTE  Provider location: [facility]   I connected with Carrie Sheppard on 04/13/20 at  1:00 PM EST by MyChart Video Encounter at home and verified that I am speaking with the correct person using two identifiers.   I discussed the limitations, risks, security and privacy concerns of performing an evaluation and management service virtually and the availability of in person appointments. I also discussed with the patient that there may be a patient responsible charge related to this service. The patient expressed understanding and agreed to proceed. Subjective:  Carrie Sheppard is a 35 y.o. G4P0030 at [redacted]w[redacted]d being seen today for ongoing prenatal care.  She is currently monitored for the following issues for this low-risk pregnancy and has Supervision of other normal pregnancy, antepartum on their problem list.  Patient reports no complaints.   .  .   . Denies any leaking of fluid.   The following portions of the patient's history were reviewed and updated as appropriate: allergies, current medications, past family history, past medical history, past social history, past surgical history and problem list.   Objective:  There were no vitals filed for this visit.  Fetal Status:           General:  Alert, oriented and cooperative. Patient is in no acute distress.  Respiratory: Normal respiratory effort, no problems with respiration noted  Mental Status: Normal mood and affect. Normal behavior. Normal judgment and thought content.  Rest of physical exam deferred due to type of encounter  Imaging: No results found.  Assessment and Plan:  Pregnancy: G4P0030 at [redacted]w[redacted]d 1. [redacted] weeks gestation of pregnancy   2. Supervision of other normal pregnancy, antepartum Not feeling baby move yet Questions answered - [place] MFM OB COMP + 14 WK; Future  Preterm labor symptoms and general obstetric precautions including  but not limited to vaginal bleeding, contractions, leaking of fluid and fetal movement were reviewed in detail with the patient. I discussed the assessment and treatment plan with the patient. The patient was provided an opportunity to ask questions and all were answered. The patient agreed with the plan and demonstrated an understanding of the instructions. The patient was advised to call back or seek an in-person office evaluation/go to [facility] at [facility] for any urgent or concerning symptoms. Please refer to After Visit Summary for other counseling recommendations.   I provided 14 minutes of face-to-face time during this encounter.  No follow-ups on file.  No future appointments.  Carrie Heritage, DO Center for [employer], [facility]

## 2020-05-11 ENCOUNTER — Ambulatory Visit (INDEPENDENT_AMBULATORY_CARE_PROVIDER_SITE_OTHER): Payer: 59 | Admitting: Family Medicine

## 2020-05-11 ENCOUNTER — Other Ambulatory Visit: Payer: Self-pay

## 2020-05-11 VITALS — BP 114/78 | HR 79 | Wt 139.0 lb

## 2020-05-11 DIAGNOSIS — Z3480 Encounter for supervision of other normal pregnancy, unspecified trimester: Secondary | ICD-10-CM

## 2020-05-11 DIAGNOSIS — Z3A18 18 weeks gestation of pregnancy: Secondary | ICD-10-CM

## 2020-05-11 NOTE — Progress Notes (Signed)
   PRENATAL VISIT NOTE  Subjective:  Carrie Sheppard is a 35 y.o. G4P0030 at [redacted]w[redacted]d being seen today for ongoing prenatal care.  She is currently monitored for the following issues for this low-risk pregnancy and has Supervision of other normal pregnancy, antepartum on their problem list.  Patient reports no complaints.  Contractions: Not present. Vag. Bleeding: None.  Movement: Present. Denies leaking of fluid.   The following portions of the patient's history were reviewed and updated as appropriate: allergies, current medications, past family history, past medical history, past social history, past surgical history and problem list.   Objective:   Vitals:   05/11/20 1610  BP: 114/78  Pulse: 79  Weight: 139 lb (63 kg)    Fetal Status: Fetal Heart Rate (bpm): 142 Fundal Height: 19 cm Movement: Present     General:  Alert, oriented and cooperative. Patient is in no acute distress.  Skin: Skin is warm and dry. No rash noted.   Cardiovascular: Normal heart rate noted  Respiratory: Normal respiratory effort, no problems with respiration noted  Abdomen: Soft, gravid, appropriate for gestational age.  Pain/Pressure: Absent     Pelvic: Cervical exam deferred        Extremities: Normal range of motion.  Edema: None  Mental Status: Normal mood and affect. Normal behavior. Normal judgment and thought content.   Assessment and Plan:  Pregnancy: G4P0030 at [redacted]w[redacted]d  1. [redacted] weeks gestation of pregnancy - AFP, Serum, Open Spina Bifida  2. Supervision of other normal pregnancy, antepartum FHT and FH normal   Preterm labor symptoms and general obstetric precautions including but not limited to vaginal bleeding, contractions, leaking of fluid and fetal movement were reviewed in detail with the patient. Please refer to After Visit Summary for other counseling recommendations.   No follow-ups on file.  Future Appointments  Date Time Provider Department Center  05/17/2020  8:45 AM [facility] US5  [facility]  06/09/2020  8:15 AM Levie Heritage, DO [facility] None    Levie Heritage, DO

## 2020-05-13 LAB — AFP, SERUM, OPEN SPINA BIFIDA
AFP MoM: 1.43
AFP Value: 69.4 ng/mL
Gest. Age on Collection Date: 18.4 weeks
Maternal Age At EDD: 34 yr
OSBR Risk 1 IN: 3311
Test Results:: NEGATIVE
Weight: 139 [lb_av]

## 2020-05-17 ENCOUNTER — Other Ambulatory Visit: Payer: Self-pay

## 2020-05-17 ENCOUNTER — Ambulatory Visit: Payer: 59 | Attending: Obstetrics and Gynecology

## 2020-05-17 DIAGNOSIS — Z3480 Encounter for supervision of other normal pregnancy, unspecified trimester: Secondary | ICD-10-CM | POA: Diagnosis not present

## 2020-05-18 ENCOUNTER — Other Ambulatory Visit: Payer: Self-pay | Admitting: *Deleted

## 2020-05-18 DIAGNOSIS — Z362 Encounter for other antenatal screening follow-up: Secondary | ICD-10-CM

## 2020-06-07 ENCOUNTER — Other Ambulatory Visit: Payer: Self-pay

## 2020-06-07 ENCOUNTER — Ambulatory Visit (INDEPENDENT_AMBULATORY_CARE_PROVIDER_SITE_OTHER): Payer: Self-pay | Admitting: Advanced Practice Midwife

## 2020-06-07 ENCOUNTER — Encounter: Payer: Self-pay | Admitting: Advanced Practice Midwife

## 2020-06-07 VITALS — BP 114/66 | HR 78 | Wt 142.0 lb

## 2020-06-07 DIAGNOSIS — Z3A22 22 weeks gestation of pregnancy: Secondary | ICD-10-CM

## 2020-06-07 NOTE — Progress Notes (Signed)
   PRENATAL VISIT NOTE  Subjective:  Carrie Sheppard is a 35 y.o. G4P0030 at [redacted]w[redacted]d being seen today for ongoing prenatal care.  She is currently monitored for the following issues for this low-risk pregnancy and has Supervision of other normal pregnancy, antepartum on their problem list.  Patient reports no complaints.  Contractions: Not present. Vag. Bleeding: None.  Movement: Present. Denies leaking of fluid.   The following portions of the patient's history were reviewed and updated as appropriate: allergies, current medications, past family history, past medical history, past social history, past surgical history and problem list.   Objective:   Vitals:   06/07/20 1111  BP: 114/66  Pulse: 78  Weight: 142 lb (64.4 kg)    Fetal Status: Fetal Heart Rate (bpm): 134   Movement: Present     General:  Alert, oriented and cooperative. Patient is in no acute distress.  Skin: Skin is warm and dry. No rash noted.   Cardiovascular: Normal heart rate noted  Respiratory: Normal respiratory effort, no problems with respiration noted  Abdomen: Soft, gravid, appropriate for gestational age.  Pain/Pressure: Absent     Pelvic: Cervical exam deferred        Extremities: Normal range of motion.  Edema: None  Mental Status: Normal mood and affect. Normal behavior. Normal judgment and thought content.   Assessment and Plan:  Pregnancy: G4P0030 at [redacted]w[redacted]d 1. [redacted] weeks gestation of pregnancy     Feeling movement daily     Reviewed signs to report, where to go     Discussed round ligament pains     glucola next visit  Preterm labor symptoms and general obstetric precautions including but not limited to vaginal bleeding, contractions, leaking of fluid and fetal movement were reviewed in detail with the patient. Please refer to After Visit Summary for other counseling recommendations.   Return in about 4 weeks (around 07/05/2020) for [facility].  Future Appointments  Date Time Provider  Department Center  06/21/2020  7:45 AM [facility] NURSE [facility]  06/21/2020  8:00 AM [facility] US1 [facility]  07/06/2020  9:45 AM Stinson, Rhona Raider, DO [facility] None    Wynelle Bourgeois, CNM

## 2020-06-07 NOTE — Patient Instructions (Signed)

## 2020-06-09 ENCOUNTER — Encounter: Payer: 59 | Admitting: Family Medicine

## 2020-06-21 ENCOUNTER — Ambulatory Visit: Payer: 59 | Admitting: *Deleted

## 2020-06-21 ENCOUNTER — Ambulatory Visit: Payer: 59 | Attending: Obstetrics

## 2020-06-21 ENCOUNTER — Encounter: Payer: Self-pay | Admitting: *Deleted

## 2020-06-21 ENCOUNTER — Other Ambulatory Visit: Payer: Self-pay

## 2020-06-21 DIAGNOSIS — Z362 Encounter for other antenatal screening follow-up: Secondary | ICD-10-CM | POA: Diagnosis not present

## 2020-06-21 DIAGNOSIS — Z3480 Encounter for supervision of other normal pregnancy, unspecified trimester: Secondary | ICD-10-CM | POA: Insufficient documentation

## 2020-06-21 DIAGNOSIS — Z3A24 24 weeks gestation of pregnancy: Secondary | ICD-10-CM

## 2020-07-06 ENCOUNTER — Ambulatory Visit (INDEPENDENT_AMBULATORY_CARE_PROVIDER_SITE_OTHER): Payer: 59 | Admitting: Family Medicine

## 2020-07-06 ENCOUNTER — Other Ambulatory Visit: Payer: Self-pay

## 2020-07-06 VITALS — BP 122/67 | HR 70 | Wt 147.0 lb

## 2020-07-06 DIAGNOSIS — Z3480 Encounter for supervision of other normal pregnancy, unspecified trimester: Secondary | ICD-10-CM

## 2020-07-06 DIAGNOSIS — Z23 Encounter for immunization: Secondary | ICD-10-CM | POA: Diagnosis not present

## 2020-07-06 DIAGNOSIS — Z3A26 26 weeks gestation of pregnancy: Secondary | ICD-10-CM

## 2020-07-06 NOTE — Patient Instructions (Signed)
AREA PEDIATRIC/FAMILY PRACTICE PHYSICIANS  [place] ([address]) . [facility] o Chambliss, MD; Eniola, MD; Hale, MD; Hensel, MD; McDiarmid, MD; McIntyer, MD; Neal, MD; Walden, MD o [address] [place], [place] [address] o [phone] o Mon-Fri 8:30-12:30, 1:30-5:00 o Providers come to see babies at [facility] o Accepting Medicaid . [facility] o Limited providers who accept newborns: Koirala, MD; Morrow, MD; Wolters, MD o [address], [place], [place] [address] o [phone] o Mon-Fri 8:00-5:30 o Babies seen by providers at [facility] o Does NOT accept Medicaid o Please call early in hospitalization for appointment (limited availability)  . [facility] o Mulberry, MD o [address] [place], [place] [address] o [phone] o Mon, Tue, Thur, Fri 8:30-5:00, Wed 10:00-7:00 (closed 1-2pm) o Babies seen by [facility] providers o Accepting Medicaid . [facility] Pediatrician o Rubin, MD o [address], [place], [place] [address] o [phone] o Mon-Fri 8:30-5:00, Sat 8:30-12:00 o Provider comes to see babies at [facility] o Accepting Medicaid o Must have been referred from current patients or contacted office prior to delivery . [facility] ([facility]) o Brown, MD; Chandler, MD; Ettefagh, MD; Grant, MD; Lester, MD; McCormick, MD; McQueen, MD; Prose, MD; Simha, MD; Stanley, MD; Stryffeler, NP; Tebben, NP o [address], [place], [place] [address] o [phone] o Mon, Tue, Thur, Fri 8:30-5:30, Wed 9:30-5:30, Sat 8:30-12:30 o Babies seen by [facility] providers o Accepting Medicaid o Only accepting infants of first-time parents or siblings of current patients o Hospital discharge coordinator will make follow-up appointment . Jack Amos o [address],  [place], [place]  [address] o [phone]   Fax - [phone] . [facility] o [address], [place], [place]  [address] o Phone - [phone]   Fax - [phone] . Shilpa Gosrani o [address], [place], [place]  [address] o [phone]  [place] ([address]) . [facility] o Bates, MD; Brassfield, MD; Cooper, Cox, MD; MD; Davis, MD; Dovico, MD; Ettefaugh, MD; Little, MD; Lowe, MD; Keiffer, MD; Melvin, MD; Sumner, MD; Williams, MD o [address], [place], [place] [address] o [phone] o Mon-Fri 8:30-5:00 (extended evenings Mon-Thur as needed), Sat-Sun 10:00-1:00 o Providers come to see babies at [facility] o Accepting Medicaid for families of first-time babies and families with all children in the household age 3 and under. Must register with office prior to making appointment (M-F only). . [facility] o Henson, NP; Knapp, MD; Lalonde, MD; Tysinger, PA o [address] [place], [place] [address] o [phone] o Mon-Fri 8:00-5:00 o Babies seen by providers at [facility] o Does NOT accept Medicaid/Commercial Insurance Only . [facility] [place] ([facility])  o Artis, MD; Barnes, MD; Bratton, MD; Coccaro, MD; [place] Gardner, MD; Kramer, MD; Marshall, MD; Netherton, MD; Poleto, MD; Skinner, MD o [address] , [place] [address] o [phone] o Mon-Fri 8:30-5:30, Sat (Oct.-Mar.) 9:00-1:00 o Babies seen by providers at [facility] o Accepting Medicaid  [place]  ([address]) . [facility]  o Reid, MD; Warner, MD o [address], , [place] [address] o [phone] o Mon-Fri 8:30-5:00, Sat 8:30-12:00 o Providers come to see babies at [facility] o Does NOT accept Medicaid . [facility] o Becker, PA; Hagler, MD; Scifres, PA; Sun, MD; Swayne, MD o [address],  [place], [place] [address] o [phone] o Mon-Fri 8:00-5:00 o Babies seen by providers at [facility] o Does NOT accept Medicaid o Only accepting babies of parents who are patients o Please call early in hospitalization for appointment (limited availability) . [facility] o Clark, MD; Frye, MD; Kelleher, MD; Mack, NP; Miller, MD; O'Keller, MD; Patterson, NP; Pudlo, MD; Puzio, MD; Thomas, MD; Tucker, MD; Twiselton, MD o [address], [place], [place] [address] o [phone] o Mon-Fri 8:00-5:00, Sat 9:00-12:00 o Providers come to see babies at [facility] o Does NOT accept Medicaid  [place] ([address]) . [facility] o Limited providers accepting new patients: Brake, NP; Wharton, PA o [address], [place], [place] [address] o [phone] o Mon-Fri 8:00-5:00 o Babies seen by providers at [facility] o Does NOT accept Medicaid o Only accepting babies of parents who are patients o Please call early in hospitalization for appointment (limited availability) . [facility] o Gay, MD; Quinlan, MD o [address] [place], [place] [address] o [phone] (press 1 to schedule appointment) o Mon-Fri 8:00-5:00 o Providers come to see babies at [facility] o Does NOT accept Medicaid . [facility] o Mazer, MD o [address] [place], [place] [address] o [phone] o Mon-Fri 8:30-5:00 (lunch 12:30-1:00), extended hours by appointment only Wed 5:00-6:30 o Babies seen by [facility] providers o Accepting Medicaid . [facility] o Banks, MD; Jordan, MD; Koberlein, MD o [address], [place], [place] [address] o [phone] o Mon-Fri 8:00-5:00 o Babies seen by [facility] providers o Does NOT accept Medicaid .  [facility] o Parker, MD; Hunter, MD; Wallace, DO o [address] [place], [place]  [address] o [phone] o Mon-Fri 8:00-5:00 o Babies seen by [facility] providers o Does NOT accept Medicaid . [facility] o Brandon, PA; Brecken, PA; Christy, NP; Dees, MD; DeClaire, MD; DeWeese, MD; Hansen, NP; Mills, NP; Parrish, NP; Smoot, NP; Summer, MD; Vapne, MD o [address] [place], [place] [address] o [phone] o Mon-Fri 8:30-5:00, Sat 10:00-1:00 o Providers come to see babies at [facility] o Does NOT accept Medicaid o Free prenatal information session Tuesdays at 4:45pm . [facility] o Bouska, MD; Gordon, PA; Jeffery, PA; Weber, PA o [address] [place] [address] o [phone] o Mon-Fri 7:30-5:30 o Babies seen by [facility] providers . [facility] o [address], [place], [place]  [address] o [phone]   Fax - [phone]  [place] ([address] & [address]) . [facility] o [place], [place] o [address] [place], [place] [address] o [phone] o Mon-Thur 8:00-6:00 o Providers come to see babies at [facility] o Accepting Medicaid . [facility] o Anderson, NP; Badger, MD; Beal, PA; Spencer, PA o [address] [place], [place] [address] o [phone] o Mon-Thur 7:30-7:30, Fri 7:30-4:30 o Babies seen by [facility] providers o Accepting Medicaid . [facility] o Agbuya, MD; Klett, NP; Romgoolam, MD o [address], [place],  [address] o [phone] o Mon-Fri 8:30-5:00, Sat 8:30-12:00 o Providers come to see babies at [facility] o Accepting Medicaid o Must have "Meet & Greet" appointment at office prior to delivery . [facility] o McCord,   MD; Wallace, MD; Wood, MD o [address], [place], [place] [address] o [phone] o Mon-Wed 8:00-6:00, Thur-Fri 8:00-5:00, Sat 9:00-12:00 o Providers come to  see babies at [facility] o Does NOT accept Medicaid o Only accepting siblings of current patients . [facility]  o [address], [place], [place]  [address] o [phone]   Fax - [phone] . [facility] o [address], [place], [place]  [address] o [phone]   Fax - [phone]  [place] ([address] & [address]) . [facility] [facility] o Cirigliano, DO; Matthews, DO o [address] [place], [place] [address] o [phone] o Mon-Fri 7:00-5:00 o Babies seen by [facility] providers o Does NOT accept Medicaid . [facility] o Briscoe, MD; Howley, PA; Moreira, PA o [address], [place], [place] [address] o [phone] o Mon-Fri 8:00-5:00 o Babies seen by [facility] providers o Accepting Medicaid . [facility] o Boyd, MD; Church, PA; Jones, NP; Osborn, PA o [address], , [place] [address] o [phone] o Mon-Fri 8:00-5:00 o Babies seen by providers at [facility] o Accepting Medicaid  [place] ([address]) . [facility] [place] o Wendling, DO o [address] [place], [place] [address] o [phone] o Mon-Fri 8:00-5:00 o Babies seen by [facility] providers o Does NOT accept Medicaid o Limited availability, please call early in hospitalization to schedule follow-up . [facility] o Calderon, PA; Cummings, MD; Dillard, MD; Martin, PA; Olson, MD; VanDeven, PA o [address], [place], [place] [address] o [phone] o Mon-Fri 8:30-5:00, Sat 9:00-12:00 o Babies seen by providers at [facility] o Accepting Medicaid o Please register online then schedule online or call office o [email or web address] . [facility] ([facility] o Hunter, NP; Kumar, MD; Martin Rogers, PA o [address], [place], [place] [address] o [phone] o Mon-Fri 8:00-5:00 o Babies seen by providers at [facility] o Accepting Medicaid . [facility] ([facility] o [place], MD; Kristi Fleenor, NP; West, MD o [address], [place], [place] [address] o [phone] o Mon-Fri 8:00-5:30, Sat&Sun by appointment (phones open at 8:30) o Babies seen by [facility] providers o Accepting Medicaid o Must be a first-time baby or sibling of current patient . [facility] [place]  o [address], [place], [place]  [address] o [phone]   Fax - [phone]  [place] ([address]) . [facility] o Brown, PA; Cowen, PA; Rice, MD; Helton, PA; Spry, MD o [address] [place], [place] [address] o [phone] o Mon-Thur 8:00-7:00, Fri 8:00-5:00, Sat 8:00-12:00, Sun 9:00-12:00 o Babies seen by [facility] providers o Accepting Medicaid . [facility] o Coe-Goins, MD; Marshall, MD; Pierre-Louis, MD o [address], [place], [place] [address] o [phone] o Mon-Thur 8:00-5:00 o Babies seen by providers at [facility] o Accepting Medicaid . [facility] o Bratton, MD; Coe-Goins, MD; Hayes, MD; Lewis, MD; List, MD; Lott, MD; Marshall, MD; Moran, MD; O'Neal, MD; Pierre-Louis, MD; Pitonzo, MD; Scholer, MD; Spangle, MD o [address] [place], [place]   [address] o [phone] o Mon-Fri 8:00-5:30, Sat (Oct.-Mar.) 9:00-1:00 o Babies seen by providers at [facility] o Accepting Medicaid o Must fill out new patient packet, available online at [email or web address] . [facility] ([facility] o Friddle, NP; Harris, NP; Kelly, NP; Logan, MD;  Melvin, PA; Poth, MD; Ramadoss, MD; Stanton, NP o [address], [place], [place] [address] o [phone] o Mon-Thur 8:00-5:30, Fri 8:00-5:00 o Babies seen by providers at [facility] o Accepting Medicaid  [place] ([address]) . [facility] o Dixon, PA; Cecilton, MD; Pickard, MD; Tapia, PA o [address], [place], [place] [address] o [phone] o Mon-Fri 8:00-5:00 o Babies seen by providers at [facility] o Accepting Medicaid   [place] ([address]) . [facility] [place] o Masneri, DO; Meyers, MD; Nelson, PA o [address], [place], [place] [address] o [phone] o Mon-Fri 8:00-5:00 o Babies seen by providers at [facility] o Does NOT accept Medicaid o Limited appointment availability, please call early in hospitalization  . [facility] [place] o Kunedd, DO; McGowen, MD o [address], [place], [place] [address] o [phone] o Mon-Fri 8:00-5:00 o Babies seen by [facility] providers o Does NOT accept Medicaid . [facility] [place] o Cameron, MD; MacDonald, MD; Michaels, PA; Nayak, MD o [address] [place] [address], [place], [place] [address] o [phone] o Mon-Fri 8:00-5:00 o After hours clinic ([address] [place], [place] [address]) [phone] Mon-Fri 5:00-8:00, Sat 12:00-6:00, Sun 10:00-4:00 o Babies seen by [facility] providers o Accepting Medicaid . [facility] [place] o [address], [place], [place]  [address] o [phone]   Fax - [phone]  [place] ([address]) . [facility] [place] [facility] o Andy, MD o [address], [place], [place] [address] o [phone] o Mon-Fri 8:00-5:00 o Babies seen by [facility] providers o Does NOT accept Medicaid . [facility] [place] ([facility] [place]) o Eksir, MD o [address], [place], [place]  [address] o [phone] o Mon-Thur 8:00-7:00, Fri 8:00-5:00, Sat 8:00-12:00 o Babies seen by providers at [facility] o Accepting Medicaid - but does not have vaccinations in office (must be received elsewhere) o Limited availability, please call early in hospitalization  [place] ([address]) . [facility]  o Charlene Flemming, MD o [address], [place]  [address] o [phone]  Fax [phone]   

## 2020-07-06 NOTE — Progress Notes (Signed)
   PRENATAL VISIT NOTE  Subjective:  Carrie Sheppard is a 35 y.o. G4P0030 at [redacted]w[redacted]d being seen today for ongoing prenatal care.  She is currently monitored for the following issues for this low-risk pregnancy and has Supervision of other normal pregnancy, antepartum on their problem list.  Patient reports no complaints.  Contractions: Not present. Vag. Bleeding: None.  Movement: Present. Denies leaking of fluid.   The following portions of the patient's history were reviewed and updated as appropriate: allergies, current medications, past family history, past medical history, past social history, past surgical history and problem list.   Objective:   Vitals:   07/06/20 0834  BP: 122/67  Pulse: 70  Weight: 147 lb (66.7 kg)    Fetal Status:     Movement: Present     General:  Alert, oriented and cooperative. Patient is in no acute distress.  Skin: Skin is warm and dry. No rash noted.   Cardiovascular: Normal heart rate noted  Respiratory: Normal respiratory effort, no problems with respiration noted  Abdomen: Soft, gravid, appropriate for gestational age.  Pain/Pressure: Absent     Pelvic: Cervical exam deferred        Extremities: Normal range of motion.  Edema: None  Mental Status: Normal mood and affect. Normal behavior. Normal judgment and thought content.   Assessment and Plan:  Pregnancy: G4P0030 at [redacted]w[redacted]d 1. [redacted] weeks gestation of pregnancy - Glucose Tolerance, 2 Hours w/1 Hour - HIV antibody (with reflex) - RPR - CBC  2. Supervision of other normal pregnancy, antepartum FHT and FH normal.   Preterm labor symptoms and general obstetric precautions including but not limited to vaginal bleeding, contractions, leaking of fluid and fetal movement were reviewed in detail with the patient. Please refer to After Visit Summary for other counseling recommendations.   Return in about 4 weeks (around 08/03/2020) for OB f/u, In Office.  Future Appointments  Date Time Provider  Department Center  07/06/2020  9:45 AM Levie Heritage, DO [facility] None  08/03/2020  4:00 PM Venora Maples, MD [facility] None    Levie Heritage, DO

## 2020-07-07 LAB — HIV ANTIBODY (ROUTINE TESTING W REFLEX): HIV Screen 4th Generation wRfx: NONREACTIVE

## 2020-07-07 LAB — GLUCOSE TOLERANCE, 2 HOURS W/ 1HR
Glucose, 1 hour: 135 mg/dL (ref 65–179)
Glucose, 2 hour: 142 mg/dL (ref 65–152)
Glucose, Fasting: 81 mg/dL (ref 65–91)

## 2020-07-07 LAB — CBC
Hematocrit: 32.8 % — ABNORMAL LOW (ref 34.0–46.6)
Hemoglobin: 11 g/dL — ABNORMAL LOW (ref 11.1–15.9)
MCH: 29.2 pg (ref 26.6–33.0)
MCHC: 33.5 g/dL (ref 31.5–35.7)
MCV: 87 fL (ref 79–97)
Platelets: 283 10*3/uL (ref 150–450)
RBC: 3.77 x10E6/uL (ref 3.77–5.28)
RDW: 12.3 % (ref 11.7–15.4)
WBC: 9.5 10*3/uL (ref 3.4–10.8)

## 2020-07-07 LAB — RPR: RPR Ser Ql: NONREACTIVE

## 2020-08-03 ENCOUNTER — Other Ambulatory Visit: Payer: Self-pay

## 2020-08-03 ENCOUNTER — Ambulatory Visit (INDEPENDENT_AMBULATORY_CARE_PROVIDER_SITE_OTHER): Payer: Commercial Managed Care - PPO | Admitting: Family Medicine

## 2020-08-03 ENCOUNTER — Encounter: Payer: Self-pay | Admitting: Family Medicine

## 2020-08-03 VITALS — BP 116/68 | HR 85 | Wt 154.0 lb

## 2020-08-03 DIAGNOSIS — Z3480 Encounter for supervision of other normal pregnancy, unspecified trimester: Secondary | ICD-10-CM

## 2020-08-03 NOTE — Progress Notes (Signed)
+   Fetal movement. No complaints.  

## 2020-08-03 NOTE — Patient Instructions (Signed)
 Contraception Choices Contraception, also called birth control, refers to methods or devices that prevent pregnancy. Hormonal methods Contraceptive implant A contraceptive implant is a thin, plastic tube that contains a hormone that prevents pregnancy. It is different from an intrauterine device (IUD). It is inserted into the upper part of the arm by a health care provider. Implants can be effective for up to 3 years. Progestin-only injections Progestin-only injections are injections of progestin, a synthetic form of the hormone progesterone. They are given every 3 months by a health care provider. Birth control pills Birth control pills are pills that contain hormones that prevent pregnancy. They must be taken once a day, preferably at the same time each day. A prescription is needed to use this method of contraception. Birth control patch The birth control patch contains hormones that prevent pregnancy. It is placed on the skin and must be changed once a week for three weeks and removed on the fourth week. A prescription is needed to use this method of contraception. Vaginal ring A vaginal ring contains hormones that prevent pregnancy. It is placed in the vagina for three weeks and removed on the fourth week. After that, the process is repeated with a new ring. A prescription is needed to use this method of contraception. Emergency contraceptive Emergency contraceptives prevent pregnancy after unprotected sex. They come in pill form and can be taken up to 5 days after sex. They work best the sooner they are taken after having sex. Most emergency contraceptives are available without a prescription. This method should not be used as your only form of birth control.   Barrier methods Female condom A female condom is a thin sheath that is worn over the penis during sex. Condoms keep sperm from going inside a woman's body. They can be used with a sperm-killing substance (spermicide) to increase their  effectiveness. They should be thrown away after one use. Female condom A female condom is a soft, loose-fitting sheath that is put into the vagina before sex. The condom keeps sperm from going inside a woman's body. They should be thrown away after one use. Diaphragm A diaphragm is a soft, dome-shaped barrier. It is inserted into the vagina before sex, along with a spermicide. The diaphragm blocks sperm from entering the uterus, and the spermicide kills sperm. A diaphragm should be left in the vagina for 6-8 hours after sex and removed within 24 hours. A diaphragm is prescribed and fitted by a health care provider. A diaphragm should be replaced every 1-2 years, after giving birth, after gaining more than 15 lb (6.8 kg), and after pelvic surgery. Cervical cap A cervical cap is a round, soft latex or plastic cup that fits over the cervix. It is inserted into the vagina before sex, along with spermicide. It blocks sperm from entering the uterus. The cap should be left in place for 6-8 hours after sex and removed within 48 hours. A cervical cap must be prescribed and fitted by a health care provider. It should be replaced every 2 years. Sponge A sponge is a soft, circular piece of polyurethane foam with spermicide in it. The sponge helps block sperm from entering the uterus, and the spermicide kills sperm. To use it, you make it wet and then insert it into the vagina. It should be inserted before sex, left in for at least 6 hours after sex, and removed and thrown away within 30 hours. Spermicides Spermicides are chemicals that kill or block sperm from entering the   cervix and uterus. They can come as a cream, jelly, suppository, foam, or tablet. A spermicide should be inserted into the vagina with an applicator at least 10-15 minutes before sex to allow time for it to work. The process must be repeated every time you have sex. Spermicides do not require a prescription.   Intrauterine  contraception Intrauterine device (IUD) An IUD is a T-shaped device that is put in a woman's uterus. There are two types:  Hormone IUD.This type contains progestin, a synthetic form of the hormone progesterone. This type can stay in place for 3-5 years.  Copper IUD.This type is wrapped in copper wire. It can stay in place for 10 years. Permanent methods of contraception Female tubal ligation In this method, a woman's fallopian tubes are sealed, tied, or blocked during surgery to prevent eggs from traveling to the uterus. Hysteroscopic sterilization In this method, a small, flexible insert is placed into each fallopian tube. The inserts cause scar tissue to form in the fallopian tubes and block them, so sperm cannot reach an egg. The procedure takes about 3 months to be effective. Another form of birth control must be used during those 3 months. Female sterilization This is a procedure to tie off the tubes that carry sperm (vasectomy). After the procedure, the man can still ejaculate fluid (semen). Another form of birth control must be used for 3 months after the procedure. Natural planning methods Natural family planning In this method, a couple does not have sex on days when the woman could become pregnant. Calendar method In this method, the woman keeps track of the length of each menstrual cycle, identifies the days when pregnancy can happen, and does not have sex on those days. Ovulation method In this method, a couple avoids sex during ovulation. Symptothermal method This method involves not having sex during ovulation. The woman typically checks for ovulation by watching changes in her temperature and in the consistency of cervical mucus. Post-ovulation method In this method, a couple waits to have sex until after ovulation. Where to find more information  Centers for Disease Control and Prevention: www.cdc.gov Summary  Contraception, also called birth control, refers to methods or  devices that prevent pregnancy.  Hormonal methods of contraception include implants, injections, pills, patches, vaginal rings, and emergency contraceptives.  Barrier methods of contraception can include female condoms, female condoms, diaphragms, cervical caps, sponges, and spermicides.  There are two types of IUDs (intrauterine devices). An IUD can be put in a woman's uterus to prevent pregnancy for 3-5 years.  Permanent sterilization can be done through a procedure for males and females. Natural family planning methods involve nothaving sex on days when the woman could become pregnant. This information is not intended to replace advice given to you by your health care provider. Make sure you discuss any questions you have with your health care provider. Document Revised: 08/03/2019 Document Reviewed: 08/03/2019 Elsevier Patient Education  2021 [employer] Inc.   Breastfeeding  Choosing to breastfeed is one of the best decisions you can make for yourself and your baby. A change in hormones during pregnancy causes your breasts to make breast milk in your milk-producing glands. Hormones prevent breast milk from being released before your baby is born. They also prompt milk flow after birth. Once breastfeeding has begun, thoughts of your baby, as well as his or her sucking or crying, can stimulate the release of milk from your milk-producing glands. Benefits of breastfeeding Research shows that breastfeeding offers many health benefits   for infants and mothers. It also offers a cost-free and convenient way to feed your baby. For your baby  Your first milk (colostrum) helps your baby's digestive system to function better.  Special cells in your milk (antibodies) help your baby to fight off infections.  Breastfed babies are less likely to develop asthma, allergies, obesity, or type 2 diabetes. They are also at lower risk for sudden infant death syndrome (SIDS).  Nutrients in breast milk are better  able to meet your baby's needs compared to infant formula.  Breast milk improves your baby's brain development. For you  Breastfeeding helps to create a very special bond between you and your baby.  Breastfeeding is convenient. Breast milk costs nothing and is always available at the correct temperature.  Breastfeeding helps to burn calories. It helps you to lose the weight that you gained during pregnancy.  Breastfeeding makes your uterus return faster to its size before pregnancy. It also slows bleeding (lochia) after you give birth.  Breastfeeding helps to lower your risk of developing type 2 diabetes, osteoporosis, rheumatoid arthritis, cardiovascular disease, and breast, ovarian, uterine, and endometrial cancer later in life. Breastfeeding basics Starting breastfeeding  Find a comfortable place to sit or lie down, with your neck and back well-supported.  Place a pillow or a rolled-up blanket under your baby to bring him or her to the level of your breast (if you are seated). Nursing pillows are specially designed to help support your arms and your baby while you breastfeed.  Make sure that your baby's tummy (abdomen) is facing your abdomen.  Gently massage your breast. With your fingertips, massage from the outer edges of your breast inward toward the nipple. This encourages milk flow. If your milk flows slowly, you may need to continue this action during the feeding.  Support your breast with 4 fingers underneath and your thumb above your nipple (make the letter "C" with your hand). Make sure your fingers are well away from your nipple and your baby's mouth.  Stroke your baby's lips gently with your finger or nipple.  When your baby's mouth is open wide enough, quickly bring your baby to your breast, placing your entire nipple and as much of the areola as possible into your baby's mouth. The areola is the colored area around your nipple. ? More areola should be visible above your  baby's upper lip than below the lower lip. ? Your baby's lips should be opened and extended outward (flanged) to ensure an adequate, comfortable latch. ? Your baby's tongue should be between his or her lower gum and your breast.  Make sure that your baby's mouth is correctly positioned around your nipple (latched). Your baby's lips should create a seal on your breast and be turned out (everted).  It is common for your baby to suck about 2-3 minutes in order to start the flow of breast milk. Latching Teaching your baby how to latch onto your breast properly is very important. An improper latch can cause nipple pain, decreased milk supply, and poor weight gain in your baby. Also, if your baby is not latched onto your nipple properly, he or she may swallow some air during feeding. This can make your baby fussy. Burping your baby when you switch breasts during the feeding can help to get rid of the air. However, teaching your baby to latch on properly is still the best way to prevent fussiness from swallowing air while breastfeeding. Signs that your baby has successfully latched onto   your nipple  Silent tugging or silent sucking, without causing you pain. Infant's lips should be extended outward (flanged).  Swallowing heard between every 3-4 sucks once your milk has started to flow (after your let-down milk reflex occurs).  Muscle movement above and in front of his or her ears while sucking. Signs that your baby has not successfully latched onto your nipple  Sucking sounds or smacking sounds from your baby while breastfeeding.  Nipple pain. If you think your baby has not latched on correctly, slip your finger into the corner of your baby's mouth to break the suction and place it between your baby's gums. Attempt to start breastfeeding again. Signs of successful breastfeeding Signs from your baby  Your baby will gradually decrease the number of sucks or will completely stop sucking.  Your baby  will fall asleep.  Your baby's body will relax.  Your baby will retain a small amount of milk in his or her mouth.  Your baby will let go of your breast by himself or herself. Signs from you  Breasts that have increased in firmness, weight, and size 1-3 hours after feeding.  Breasts that are softer immediately after breastfeeding.  Increased milk volume, as well as a change in milk consistency and color by the fifth day of breastfeeding.  Nipples that are not sore, cracked, or bleeding. Signs that your baby is getting enough milk  Wetting at least 1-2 diapers during the first 24 hours after birth.  Wetting at least 5-6 diapers every 24 hours for the first week after birth. The urine should be clear or pale yellow by the age of 5 days.  Wetting 6-8 diapers every 24 hours as your baby continues to grow and develop.  At least 3 stools in a 24-hour period by the age of 5 days. The stool should be soft and yellow.  At least 3 stools in a 24-hour period by the age of 7 days. The stool should be seedy and yellow.  No loss of weight greater than 10% of birth weight during the first 3 days of life.  Average weight gain of 4-7 oz (113-198 g) per week after the age of 4 days.  Consistent daily weight gain by the age of 5 days, without weight loss after the age of 2 weeks. After a feeding, your baby may spit up a small amount of milk. This is normal. Breastfeeding frequency and duration Frequent feeding will help you make more milk and can prevent sore nipples and extremely full breasts (breast engorgement). Breastfeed when you feel the need to reduce the fullness of your breasts or when your baby shows signs of hunger. This is called "breastfeeding on demand." Signs that your baby is hungry include:  Increased alertness, activity, or restlessness.  Movement of the head from side to side.  Opening of the mouth when the corner of the mouth or cheek is stroked (rooting).  Increased  sucking sounds, smacking lips, cooing, sighing, or squeaking.  Hand-to-mouth movements and sucking on fingers or hands.  Fussing or crying. Avoid introducing a pacifier to your baby in the first 4-6 weeks after your baby is born. After this time, you may choose to use a pacifier. Research has shown that pacifier use during the first year of a baby's life decreases the risk of sudden infant death syndrome (SIDS). Allow your baby to feed on each breast as long as he or she wants. When your baby unlatches or falls asleep while feeding from the   first breast, offer the second breast. Because newborns are often sleepy in the first few weeks of life, you may need to awaken your baby to get him or her to feed. Breastfeeding times will vary from baby to baby. However, the following rules can serve as a guide to help you make sure that your baby is properly fed:  Newborns (babies 4 weeks of age or younger) may breastfeed every 1-3 hours.  Newborns should not go without breastfeeding for longer than 3 hours during the day or 5 hours during the night.  You should breastfeed your baby a minimum of 8 times in a 24-hour period. Breast milk pumping Pumping and storing breast milk allows you to make sure that your baby is exclusively fed your breast milk, even at times when you are unable to breastfeed. This is especially important if you go back to work while you are still breastfeeding, or if you are not able to be present during feedings. Your lactation consultant can help you find a method of pumping that works best for you and give you guidelines about how long it is safe to store breast milk.      Caring for your breasts while you breastfeed Nipples can become dry, cracked, and sore while breastfeeding. The following recommendations can help keep your breasts moisturized and healthy:  Avoid using soap on your nipples.  Wear a supportive bra designed especially for nursing. Avoid wearing underwire-style  bras or extremely tight bras (sports bras).  Air-dry your nipples for 3-4 minutes after each feeding.  Use only cotton bra pads to absorb leaked breast milk. Leaking of breast milk between feedings is normal.  Use lanolin on your nipples after breastfeeding. Lanolin helps to maintain your skin's normal moisture barrier. Pure lanolin is not harmful (not toxic) to your baby. You may also hand express a few drops of breast milk and gently massage that milk into your nipples and allow the milk to air-dry. In the first few weeks after giving birth, some women experience breast engorgement. Engorgement can make your breasts feel heavy, warm, and tender to the touch. Engorgement peaks within 3-5 days after you give birth. The following recommendations can help to ease engorgement:  Completely empty your breasts while breastfeeding or pumping. You may want to start by applying warm, moist heat (in the shower or with warm, water-soaked hand towels) just before feeding or pumping. This increases circulation and helps the milk flow. If your baby does not completely empty your breasts while breastfeeding, pump any extra milk after he or she is finished.  Apply ice packs to your breasts immediately after breastfeeding or pumping, unless this is too uncomfortable for you. To do this: ? Put ice in a plastic bag. ? Place a towel between your skin and the bag. ? Leave the ice on for 20 minutes, 2-3 times a day.  Make sure that your baby is latched on and positioned properly while breastfeeding. If engorgement persists after 48 hours of following these recommendations, contact your health care provider or a lactation consultant. Overall health care recommendations while breastfeeding  Eat 3 healthy meals and 3 snacks every day. Well-nourished mothers who are breastfeeding need an additional 450-500 calories a day. You can meet this requirement by increasing the amount of a balanced diet that you eat.  Drink  enough water to keep your urine pale yellow or clear.  Rest often, relax, and continue to take your prenatal vitamins to prevent fatigue, stress, and low   vitamin and mineral levels in your body (nutrient deficiencies).  Do not use any products that contain nicotine or tobacco, such as cigarettes and e-cigarettes. Your baby may be harmed by chemicals from cigarettes that pass into breast milk and exposure to secondhand smoke. If you need help quitting, ask your health care provider.  Avoid alcohol.  Do not use illegal drugs or marijuana.  Talk with your health care provider before taking any medicines. These include over-the-counter and prescription medicines as well as vitamins and herbal supplements. Some medicines that may be harmful to your baby can pass through breast milk.  It is possible to become pregnant while breastfeeding. If birth control is desired, ask your health care provider about options that will be safe while breastfeeding your baby. Where to find more information: [employer]: www.llli.org Contact a health care provider if:  You feel like you want to stop breastfeeding or have become frustrated with breastfeeding.  Your nipples are cracked or bleeding.  Your breasts are red, tender, or warm.  You have: ? Painful breasts or nipples. ? A swollen area on either breast. ? A fever or chills. ? Nausea or vomiting. ? Drainage other than breast milk from your nipples.  Your breasts do not become full before feedings by the fifth day after you give birth.  You feel sad and depressed.  Your baby is: ? Too sleepy to eat well. ? Having trouble sleeping. ? More than 1 week old and wetting fewer than 6 diapers in a 24-hour period. ? Not gaining weight by 5 days of age.  Your baby has fewer than 3 stools in a 24-hour period.  Your baby's skin or the white parts of his or her eyes become yellow. Get help right away if:  Your baby is overly tired  (lethargic) and does not want to wake up and feed.  Your baby develops an unexplained fever. Summary  Breastfeeding offers many health benefits for infant and mothers.  Try to breastfeed your infant when he or she shows early signs of hunger.  Gently tickle or stroke your baby's lips with your finger or nipple to allow the baby to open his or her mouth. Bring the baby to your breast. Make sure that much of the areola is in your baby's mouth. Offer one side and burp the baby before you offer the other side.  Talk with your health care provider or lactation consultant if you have questions or you face problems as you breastfeed. This information is not intended to replace advice given to you by your health care provider. Make sure you discuss any questions you have with your health care provider. Document Revised: 05/23/2017 Document Reviewed: 03/30/2016 Elsevier Patient Education  2021 Else[employer] Inc.  

## 2020-08-03 NOTE — Progress Notes (Signed)
   Subjective:  Carrie Sheppard is a 35 y.o. G4P0030 at [redacted]w[redacted]d being seen today for ongoing prenatal care.  She is currently monitored for the following issues for this low-risk pregnancy and has Supervision of other normal pregnancy, antepartum on their problem list.  Patient reports no complaints.  Contractions: Not present. Vag. Bleeding: None.  Movement: Present. Denies leaking of fluid.   The following portions of the patient's history were reviewed and updated as appropriate: allergies, current medications, past family history, past medical history, past social history, past surgical history and problem list. Problem list updated.  Objective:   Vitals:   08/03/20 1555  BP: 116/68  Pulse: 85  Weight: 154 lb (69.9 kg)    Fetal Status: Fetal Heart Rate (bpm): 141   Movement: Present     General:  Alert, oriented and cooperative. Patient is in no acute distress.  Skin: Skin is warm and dry. No rash noted.   Cardiovascular: Normal heart rate noted  Respiratory: Normal respiratory effort, no problems with respiration noted  Abdomen: Soft, gravid, appropriate for gestational age. Pain/Pressure: Absent     Pelvic: Vag. Bleeding: None     Cervical exam deferred        Extremities: Normal range of motion.  Edema: None  Mental Status: Normal mood and affect. Normal behavior. Normal judgment and thought content.   Urinalysis:      Assessment and Plan:  Pregnancy: G4P0030 at [redacted]w[redacted]d  1. Supervision of other normal pregnancy, antepartum BP and FHR normal  Preterm labor symptoms and general obstetric precautions including but not limited to vaginal bleeding, contractions, leaking of fluid and fetal movement were reviewed in detail with the patient. Please refer to After Visit Summary for other counseling recommendations.  Return in 2 weeks (on 08/17/2020) for ob visit.   Venora Maples, MD

## 2020-08-18 ENCOUNTER — Ambulatory Visit (INDEPENDENT_AMBULATORY_CARE_PROVIDER_SITE_OTHER): Payer: Commercial Managed Care - PPO | Admitting: Family Medicine

## 2020-08-18 VITALS — BP 126/69 | HR 85 | Wt 151.0 lb

## 2020-08-18 DIAGNOSIS — Z3480 Encounter for supervision of other normal pregnancy, unspecified trimester: Secondary | ICD-10-CM

## 2020-08-18 NOTE — Progress Notes (Signed)
   PRENATAL VISIT NOTE  Subjective:  Carrie Sheppard is a 35 y.o. G4P0030 at [redacted]w[redacted]d being seen today for ongoing prenatal care.  She is currently monitored for the following issues for this low-risk pregnancy and has Supervision of other normal pregnancy, antepartum on their problem list.  Patient reports no complaints.  Contractions: Not present. Vag. Bleeding: None.  Movement: Present. Denies leaking of fluid.   The following portions of the patient's history were reviewed and updated as appropriate: allergies, current medications, past family history, past medical history, past social history, past surgical history and problem list.   Objective:   Vitals:   08/18/20 1122  BP: 126/69  Pulse: 85  Weight: 151 lb (68.5 kg)    Fetal Status: Fetal Heart Rate (bpm): 151 Fundal Height: 32 cm Movement: Present     General:  Alert, oriented and cooperative. Patient is in no acute distress.  Skin: Skin is warm and dry. No rash noted.   Cardiovascular: Normal heart rate noted  Respiratory: Normal respiratory effort, no problems with respiration noted  Abdomen: Soft, gravid, appropriate for gestational age.  Pain/Pressure: Absent     Pelvic: Cervical exam deferred        Extremities: Normal range of motion.  Edema: None  Mental Status: Normal mood and affect. Normal behavior. Normal judgment and thought content.   Assessment and Plan:  Pregnancy: G4P0030 at [redacted]w[redacted]d 1. Supervision of other normal pregnancy, antepartum FHT and FH normal  Preterm labor symptoms and general obstetric precautions including but not limited to vaginal bleeding, contractions, leaking of fluid and fetal movement were reviewed in detail with the patient. Please refer to After Visit Summary for other counseling recommendations.   No follow-ups on file.  Future Appointments  Date Time Provider Department Center  09/01/2020  9:15 AM Levie Heritage, DO [facility] None  09/14/2020 11:00 AM Levie Heritage, DO [facility]  None  09/22/2020 11:15 AM Levie Heritage, DO [facility] None  09/29/2020 11:00 AM Levie Heritage, DO [facility] None  10/05/2020  1:00 PM Willodean Rosenthal, MD [facility] None    Levie Heritage, DO

## 2020-08-18 NOTE — Progress Notes (Signed)
+   Fetal movement. No complaints.  

## 2020-09-01 ENCOUNTER — Ambulatory Visit (INDEPENDENT_AMBULATORY_CARE_PROVIDER_SITE_OTHER): Payer: Commercial Managed Care - PPO | Admitting: Family Medicine

## 2020-09-01 ENCOUNTER — Other Ambulatory Visit: Payer: Self-pay

## 2020-09-01 VITALS — BP 119/47 | HR 103 | Wt 155.0 lb

## 2020-09-01 DIAGNOSIS — Z3480 Encounter for supervision of other normal pregnancy, unspecified trimester: Secondary | ICD-10-CM

## 2020-09-01 DIAGNOSIS — Z3A34 34 weeks gestation of pregnancy: Secondary | ICD-10-CM

## 2020-09-01 NOTE — Progress Notes (Signed)
   PRENATAL VISIT NOTE  Subjective:  Carrie Sheppard is a 35 y.o. G4P0030 at [redacted]w[redacted]d being seen today for ongoing prenatal care.  She is currently monitored for the following issues for this low-risk pregnancy and has Supervision of other normal pregnancy, antepartum on their problem list.  Patient reports no complaints.  Contractions: Not present. Vag. Bleeding: None.  Movement: Present. Denies leaking of fluid.   The following portions of the patient's history were reviewed and updated as appropriate: allergies, current medications, past family history, past medical history, past social history, past surgical history and problem list.   Objective:   Vitals:   09/01/20 0925  BP: (!) 119/47  Pulse: (!) 103  Weight: 155 lb (70.3 kg)    Fetal Status: Fetal Heart Rate (bpm): 144   Movement: Present     General:  Alert, oriented and cooperative. Patient is in no acute distress.  Skin: Skin is warm and dry. No rash noted.   Cardiovascular: Normal heart rate noted  Respiratory: Normal respiratory effort, no problems with respiration noted  Abdomen: Soft, gravid, appropriate for gestational age.  Pain/Pressure: Absent     Pelvic: Cervical exam deferred        Extremities: Normal range of motion.  Edema: None  Mental Status: Normal mood and affect. Normal behavior. Normal judgment and thought content.   Assessment and Plan:  Pregnancy: G4P0030 at [redacted]w[redacted]d 1. [redacted] weeks gestation of pregnancy FHT and HF normal  2. Supervision of other normal pregnancy, antepartum   Preterm labor symptoms and general obstetric precautions including but not limited to vaginal bleeding, contractions, leaking of fluid and fetal movement were reviewed in detail with the patient. Please refer to After Visit Summary for other counseling recommendations.   No follow-ups on file.  Future Appointments  Date Time Provider Department Center  09/14/2020 11:00 AM Levie Heritage, DO [facility] None  09/22/2020 11:15 AM  Levie Heritage, DO [facility] None  09/29/2020 11:00 AM Levie Heritage, DO [facility] None  10/05/2020  1:00 PM Willodean Rosenthal, MD [facility] None    Levie Heritage, DO

## 2020-09-14 ENCOUNTER — Other Ambulatory Visit (HOSPITAL_COMMUNITY)
Admission: RE | Admit: 2020-09-14 | Discharge: 2020-09-14 | Disposition: A | Discharge status: Home / Self Care | Payer: Commercial Managed Care - PPO | Source: Ambulatory Visit | Attending: Family Medicine | Admitting: Family Medicine

## 2020-09-14 ENCOUNTER — Other Ambulatory Visit: Payer: Self-pay

## 2020-09-14 ENCOUNTER — Encounter: Payer: Self-pay | Admitting: Family Medicine

## 2020-09-14 ENCOUNTER — Ambulatory Visit (INDEPENDENT_AMBULATORY_CARE_PROVIDER_SITE_OTHER): Payer: Commercial Managed Care - PPO | Admitting: Family Medicine

## 2020-09-14 VITALS — BP 111/74 | HR 94 | Wt 155.0 lb

## 2020-09-14 DIAGNOSIS — Z3A36 36 weeks gestation of pregnancy: Secondary | ICD-10-CM | POA: Insufficient documentation

## 2020-09-14 DIAGNOSIS — Z3480 Encounter for supervision of other normal pregnancy, unspecified trimester: Secondary | ICD-10-CM

## 2020-09-14 DIAGNOSIS — Z3483 Encounter for supervision of other normal pregnancy, third trimester: Secondary | ICD-10-CM | POA: Diagnosis present

## 2020-09-14 MED ORDER — PANTOPRAZOLE SODIUM 40 MG PO TBEC: 40.0000 mg | DELAYED_RELEASE_TABLET | Freq: Every day | ORAL | 1 refills | Status: DC

## 2020-09-14 NOTE — Progress Notes (Signed)
   PRENATAL VISIT NOTE  Subjective:  Carrie Sheppard is a 35 y.o. G4P0030 at [redacted]w[redacted]d being seen today for ongoing prenatal care.  She is currently monitored for the following issues for this low-risk pregnancy and has Supervision of other normal pregnancy, antepartum on their problem list.  Patient reports occasional contractions.  Contractions: Irregular. Vag. Bleeding: None.  Movement: Present. Denies leaking of fluid.   The following portions of the patient's history were reviewed and updated as appropriate: allergies, current medications, past family history, past medical history, past social history, past surgical history and problem list.   Objective:   Vitals:   09/14/20 1105  BP: 111/74  Pulse: 94  Weight: 155 lb (70.3 kg)    Fetal Status: Fetal Heart Rate (bpm): 124   Movement: Present     General:  Alert, oriented and cooperative. Patient is in no acute distress.  Skin: Skin is warm and dry. No rash noted.   Cardiovascular: Normal heart rate noted  Respiratory: Normal respiratory effort, no problems with respiration noted  Abdomen: Soft, gravid, appropriate for gestational age.  Pain/Pressure: Absent     Pelvic: Cervical exam deferred        Extremities: Normal range of motion.  Edema: None  Mental Status: Normal mood and affect. Normal behavior. Normal judgment and thought content.   Assessment and Plan:  Pregnancy: G4P0030 at [redacted]w[redacted]d 1. [redacted] weeks gestation of pregnancy - Culture, beta strep (group b only) - GC/Chlamydia probe amp ([place])not at [facility]  2. Supervision of other normal pregnancy, antepartum FHT and FH normal. Having heart burn, not improved with tums. Will prescribe protonix  Preterm labor symptoms and general obstetric precautions including but not limited to vaginal bleeding, contractions, leaking of fluid and fetal movement were reviewed in detail with the patient. Please refer to After Visit Summary for other counseling recommendations.   No  follow-ups on file.  Future Appointments  Date Time Provider Department Center  09/22/2020 11:15 AM Levie Heritage, DO [facility] None  09/29/2020 11:00 AM Levie Heritage, DO [facility] None  10/05/2020  1:00 PM Willodean Rosenthal, MD [facility] None    Levie Heritage, DO

## 2020-09-15 LAB — GC/CHLAMYDIA PROBE AMP (~~LOC~~) NOT AT ARMC
Chlamydia: NEGATIVE
Comment: NEGATIVE
Comment: NORMAL
Neisseria Gonorrhea: NEGATIVE

## 2020-09-18 LAB — CULTURE, BETA STREP (GROUP B ONLY): Strep Gp B Culture: NEGATIVE

## 2020-09-22 ENCOUNTER — Other Ambulatory Visit: Payer: Self-pay

## 2020-09-22 ENCOUNTER — Ambulatory Visit (INDEPENDENT_AMBULATORY_CARE_PROVIDER_SITE_OTHER): Payer: Commercial Managed Care - PPO | Admitting: Family Medicine

## 2020-09-22 VITALS — BP 113/80 | HR 89 | Wt 157.0 lb

## 2020-09-22 DIAGNOSIS — Z3480 Encounter for supervision of other normal pregnancy, unspecified trimester: Secondary | ICD-10-CM

## 2020-09-22 DIAGNOSIS — Z3A37 37 weeks gestation of pregnancy: Secondary | ICD-10-CM

## 2020-09-22 NOTE — Progress Notes (Signed)
   PRENATAL VISIT NOTE  Subjective:  Carrie Sheppard is a 35 y.o. G4P0030 at [redacted]w[redacted]d being seen today for ongoing prenatal care.  She is currently monitored for the following issues for this low-risk pregnancy and has Supervision of other normal pregnancy, antepartum on their problem list.  Patient reports occasional contractions.  Contractions: Irregular. Vag. Bleeding: None.  Movement: Present. Denies leaking of fluid.   The following portions of the patient's history were reviewed and updated as appropriate: allergies, current medications, past family history, past medical history, past social history, past surgical history and problem list.   Objective:   Vitals:   09/22/20 1126  BP: 113/80  Pulse: 89  Weight: 157 lb (71.2 kg)    Fetal Status: Fetal Heart Rate (bpm): 132   Movement: Present     General:  Alert, oriented and cooperative. Patient is in no acute distress.  Skin: Skin is warm and dry. No rash noted.   Cardiovascular: Normal heart rate noted  Respiratory: Normal respiratory effort, no problems with respiration noted  Abdomen: Soft, gravid, appropriate for gestational age.  Pain/Pressure: Absent     Pelvic: Cervical exam deferred        Extremities: Normal range of motion.  Edema: None  Mental Status: Normal mood and affect. Normal behavior. Normal judgment and thought content.   Assessment and Plan:  Pregnancy: G4P0030 at [redacted]w[redacted]d 1. [redacted] weeks gestation of pregnancy  2. Supervision of other normal pregnancy, antepartum FHT and FH normal  Term labor symptoms and general obstetric precautions including but not limited to vaginal bleeding, contractions, leaking of fluid and fetal movement were reviewed in detail with the patient. Please refer to After Visit Summary for other counseling recommendations.   No follow-ups on file.  Future Appointments  Date Time Provider Department Center  09/29/2020 11:00 AM Levie Heritage, DO [facility] None  10/05/2020  1:00 PM  Willodean Rosenthal, MD [facility] None    Levie Heritage, DO

## 2020-09-29 ENCOUNTER — Ambulatory Visit (INDEPENDENT_AMBULATORY_CARE_PROVIDER_SITE_OTHER): Payer: Commercial Managed Care - PPO | Admitting: Family Medicine

## 2020-09-29 ENCOUNTER — Other Ambulatory Visit: Payer: Self-pay

## 2020-09-29 VITALS — BP 123/72 | HR 91 | Wt 155.0 lb

## 2020-09-29 DIAGNOSIS — Z3A38 38 weeks gestation of pregnancy: Secondary | ICD-10-CM

## 2020-09-29 NOTE — Progress Notes (Signed)
   PRENATAL VISIT NOTE  Subjective:  Carrie Sheppard is a 35 y.o. G4P0030 at [redacted]w[redacted]d being seen today for ongoing prenatal care.  She is currently monitored for the following issues for this low-risk pregnancy and has Supervision of other normal pregnancy, antepartum on their problem list.  Patient reports occasional contractions.  Contractions: Irregular. Vag. Bleeding: None.  Movement: Present. Denies leaking of fluid.   The following portions of the patient's history were reviewed and updated as appropriate: allergies, current medications, past family history, past medical history, past social history, past surgical history and problem list.   Objective:   Vitals:   09/29/20 1113  BP: 123/72  Pulse: 91  Weight: 155 lb (70.3 kg)    Fetal Status: Fetal Heart Rate (bpm): 135 Fundal Height: 37 cm Movement: Present  Presentation: Vertex  General:  Alert, oriented and cooperative. Patient is in no acute distress.  Skin: Skin is warm and dry. No rash noted.   Cardiovascular: Normal heart rate noted  Respiratory: Normal respiratory effort, no problems with respiration noted  Abdomen: Soft, gravid, appropriate for gestational age.  Pain/Pressure: Absent     Pelvic: Cervical exam performed in the presence of a chaperone Dilation: Closed Effacement (%): Thick    Extremities: Normal range of motion.  Edema: None  Mental Status: Normal mood and affect. Normal behavior. Normal judgment and thought content.   Assessment and Plan:  Pregnancy: G4P0030 at [redacted]w[redacted]d 1. [redacted] weeks gestation of pregnancy FHT and FH normal  Term labor symptoms and general obstetric precautions including but not limited to vaginal bleeding, contractions, leaking of fluid and fetal movement were reviewed in detail with the patient. Please refer to After Visit Summary for other counseling recommendations.   No follow-ups on file.  Future Appointments  Date Time Provider Department Center  10/05/2020  1:00 PM  Willodean Rosenthal, MD [facility] None    Levie Heritage, DO

## 2020-10-05 ENCOUNTER — Other Ambulatory Visit: Payer: Self-pay

## 2020-10-05 ENCOUNTER — Ambulatory Visit (INDEPENDENT_AMBULATORY_CARE_PROVIDER_SITE_OTHER): Payer: Commercial Managed Care - PPO | Admitting: Obstetrics & Gynecology

## 2020-10-05 VITALS — BP 119/80 | HR 96 | Wt 156.0 lb

## 2020-10-05 DIAGNOSIS — Z3A39 39 weeks gestation of pregnancy: Secondary | ICD-10-CM

## 2020-10-05 DIAGNOSIS — Z3480 Encounter for supervision of other normal pregnancy, unspecified trimester: Secondary | ICD-10-CM

## 2020-10-05 NOTE — Progress Notes (Signed)
   PRENATAL VISIT NOTE  Subjective:  Carrie Sheppard is a 35 y.o. G4P0030 at [redacted]w[redacted]d being seen today for ongoing prenatal care.  She is currently monitored for the following issues for this low-risk pregnancy and has Supervision of other normal pregnancy, antepartum on their problem list.  Patient reports no complaints.  Contractions: Irregular. Vag. Bleeding: None.  Movement: Present. Denies leaking of fluid.   The following portions of the patient's history were reviewed and updated as appropriate: allergies, current medications, past family history, past medical history, past social history, past surgical history and problem list.   Objective:   Vitals:   10/05/20 1307  BP: 119/80  Pulse: 96    Fetal Status: Fetal Heart Rate (bpm): 132   Movement: Present     General:  Alert, oriented and cooperative. Patient is in no acute distress.  Skin: Skin is warm and dry. No rash noted.   Cardiovascular: Normal heart rate noted  Respiratory: Normal respiratory effort, no problems with respiration noted  Abdomen: Soft, gravid, appropriate for gestational age.  Pain/Pressure: Absent     Pelvic: Cervical exam performed in the presence of a chaperone        Extremities: Normal range of motion.  Edema: None  Mental Status: Normal mood and affect. Normal behavior. Normal judgment and thought content.   Assessment and Plan:  Pregnancy: G4P0030 at [redacted]w[redacted]d 1. Supervision of other normal pregnancy, antepartum FHR and FH WNL S/p membrane stripping today.   Term labor symptoms and general obstetric precautions including but not limited to vaginal bleeding, contractions, leaking of fluid and fetal movement were reviewed in detail with the patient. Please refer to After Visit Summary for other counseling recommendations.   Return in about 1 week (around 10/12/2020).  No future appointments.  Willodean Rosenthal, MD

## 2020-10-05 NOTE — Progress Notes (Signed)
Cervix check today.

## 2020-10-08 ENCOUNTER — Inpatient Hospital Stay (HOSPITAL_COMMUNITY): Payer: Commercial Managed Care - PPO | Admitting: Anesthesiology

## 2020-10-08 ENCOUNTER — Inpatient Hospital Stay (HOSPITAL_COMMUNITY)
Admission: AD | Admit: 2020-10-08 | Discharge: 2020-10-11 | DRG: 768 | Disposition: A | Discharge status: Home / Self Care | Payer: Commercial Managed Care - PPO | Attending: Obstetrics & Gynecology | Admitting: Obstetrics & Gynecology

## 2020-10-08 ENCOUNTER — Other Ambulatory Visit: Payer: Self-pay

## 2020-10-08 ENCOUNTER — Encounter (HOSPITAL_COMMUNITY): Payer: Self-pay | Admitting: Obstetrics and Gynecology

## 2020-10-08 DIAGNOSIS — Z20822 Contact with and (suspected) exposure to covid-19: Secondary | ICD-10-CM | POA: Diagnosis present

## 2020-10-08 DIAGNOSIS — O4292 Full-term premature rupture of membranes, unspecified as to length of time between rupture and onset of labor: Principal | ICD-10-CM | POA: Diagnosis present

## 2020-10-08 DIAGNOSIS — Z3480 Encounter for supervision of other normal pregnancy, unspecified trimester: Secondary | ICD-10-CM

## 2020-10-08 DIAGNOSIS — Z3A40 40 weeks gestation of pregnancy: Secondary | ICD-10-CM | POA: Diagnosis not present

## 2020-10-08 DIAGNOSIS — Z87891 Personal history of nicotine dependence: Secondary | ICD-10-CM | POA: Diagnosis not present

## 2020-10-08 DIAGNOSIS — O4290 Premature rupture of membranes, unspecified as to length of time between rupture and onset of labor, unspecified weeks of gestation: Secondary | ICD-10-CM | POA: Diagnosis present

## 2020-10-08 DIAGNOSIS — O4202 Full-term premature rupture of membranes, onset of labor within 24 hours of rupture: Secondary | ICD-10-CM | POA: Diagnosis not present

## 2020-10-08 LAB — RESP PANEL BY RT-PCR (FLU A&B, COVID) ARPGX2
Influenza A by PCR: NEGATIVE
Influenza B by PCR: NEGATIVE
SARS Coronavirus 2 by RT PCR: NEGATIVE

## 2020-10-08 LAB — CBC
HCT: 33.6 % — ABNORMAL LOW (ref 36.0–46.0)
Hemoglobin: 11 g/dL — ABNORMAL LOW (ref 12.0–15.0)
MCH: 27.6 pg (ref 26.0–34.0)
MCHC: 32.7 g/dL (ref 30.0–36.0)
MCV: 84.4 fL (ref 80.0–100.0)
Platelets: 277 10*3/uL (ref 150–400)
RBC: 3.98 MIL/uL (ref 3.87–5.11)
RDW: 14.1 % (ref 11.5–15.5)
WBC: 11.1 10*3/uL — ABNORMAL HIGH (ref 4.0–10.5)
nRBC: 0 % (ref 0.0–0.2)

## 2020-10-08 LAB — POCT FERN TEST: POCT Fern Test: POSITIVE

## 2020-10-08 LAB — RPR: RPR Ser Ql: NONREACTIVE

## 2020-10-08 LAB — TYPE AND SCREEN
ABO/RH(D): A POS
Antibody Screen: NEGATIVE

## 2020-10-08 MED ORDER — PHENYLEPHRINE 40 MCG/ML (10ML) SYRINGE FOR IV PUSH (FOR BLOOD PRESSURE SUPPORT): 80.0000 ug | PREFILLED_SYRINGE | INTRAVENOUS | Status: DC | PRN

## 2020-10-08 MED ORDER — DIPHENHYDRAMINE HCL 50 MG/ML IJ SOLN: 12.5000 mg | INTRAMUSCULAR | Status: DC | PRN

## 2020-10-08 MED ORDER — OXYCODONE-ACETAMINOPHEN 5-325 MG PO TABS
1.0000 | ORAL_TABLET | ORAL | Status: DC | PRN
Start: 2020-10-08 — End: 2020-10-09

## 2020-10-08 MED ORDER — SODIUM CHLORIDE 0.9 % IV SOLN: 250.0000 mL | INTRAVENOUS | Status: DC | PRN

## 2020-10-08 MED ORDER — LIDOCAINE HCL (PF) 1 % IJ SOLN
30.0000 mL | INTRAMUSCULAR | Status: AC | PRN
  Administered 2020-10-09: 30 mL via SUBCUTANEOUS
  Filled 2020-10-08: qty 30

## 2020-10-08 MED ORDER — EPHEDRINE 5 MG/ML INJ: 10.0000 mg | INTRAVENOUS | Status: DC | PRN

## 2020-10-08 MED ORDER — LACTATED RINGERS IV SOLN: 500.0000 mL | INTRAVENOUS | Status: DC | PRN

## 2020-10-08 MED ORDER — FLEET ENEMA 7-19 GM/118ML RE ENEM: 1.0000 | ENEMA | RECTAL | Status: DC | PRN

## 2020-10-08 MED ORDER — TERBUTALINE SULFATE 1 MG/ML IJ SOLN: 0.2500 mg | Freq: Once | INTRAMUSCULAR | Status: DC | PRN

## 2020-10-08 MED ORDER — LIDOCAINE HCL (PF) 1 % IJ SOLN
INTRAMUSCULAR | Status: DC | PRN
  Administered 2020-10-08: 7 mL via EPIDURAL

## 2020-10-08 MED ORDER — LACTATED RINGERS IV SOLN
500.0000 mL | Freq: Once | INTRAVENOUS | Status: AC
  Administered 2020-10-08: 500 mL via INTRAVENOUS

## 2020-10-08 MED ORDER — SOD CITRATE-CITRIC ACID 500-334 MG/5ML PO SOLN: 30.0000 mL | ORAL | Status: DC | PRN

## 2020-10-08 MED ORDER — SODIUM CHLORIDE 0.9% FLUSH: 3.0000 mL | Freq: Two times a day (BID) | INTRAVENOUS | Status: DC

## 2020-10-08 MED ORDER — OXYCODONE-ACETAMINOPHEN 5-325 MG PO TABS: 2.0000 | ORAL_TABLET | ORAL | Status: DC | PRN

## 2020-10-08 MED ORDER — OXYTOCIN BOLUS FROM INFUSION
333.0000 mL | Freq: Once | INTRAVENOUS | Status: AC
  Administered 2020-10-09: 333 mL via INTRAVENOUS

## 2020-10-08 MED ORDER — PHENYLEPHRINE 40 MCG/ML (10ML) SYRINGE FOR IV PUSH (FOR BLOOD PRESSURE SUPPORT)
80.0000 ug | PREFILLED_SYRINGE | INTRAVENOUS | Status: DC | PRN
  Filled 2020-10-08: qty 10

## 2020-10-08 MED ORDER — OXYTOCIN-SODIUM CHLORIDE 30-0.9 UT/500ML-% IV SOLN
2.5000 [IU]/h | INTRAVENOUS | Status: DC
  Filled 2020-10-08: qty 500

## 2020-10-08 MED ORDER — FENTANYL-BUPIVACAINE-NACL 0.5-0.125-0.9 MG/250ML-% EP SOLN
12.0000 mL/h | EPIDURAL | Status: DC | PRN
Start: 2020-10-08 — End: 2020-10-09
  Administered 2020-10-08 – 2020-10-09 (×2): 12 mL/h via EPIDURAL
  Filled 2020-10-08 (×2): qty 250

## 2020-10-08 MED ORDER — MISOPROSTOL 50MCG HALF TABLET
50.0000 ug | ORAL_TABLET | ORAL | Status: DC
  Administered 2020-10-08 (×2): 50 ug via ORAL
  Filled 2020-10-08 (×2): qty 1

## 2020-10-08 MED ORDER — SODIUM CHLORIDE 0.9% FLUSH: 3.0000 mL | INTRAVENOUS | Status: DC | PRN

## 2020-10-08 MED ORDER — LACTATED RINGERS IV SOLN: INTRAVENOUS | Status: DC

## 2020-10-08 MED ORDER — ONDANSETRON HCL 4 MG/2ML IJ SOLN
4.0000 mg | Freq: Four times a day (QID) | INTRAMUSCULAR | Status: DC | PRN
  Administered 2020-10-09: 4 mg via INTRAVENOUS
  Filled 2020-10-08: qty 2

## 2020-10-08 MED ORDER — ACETAMINOPHEN 325 MG PO TABS: 650.0000 mg | ORAL_TABLET | ORAL | Status: DC | PRN

## 2020-10-08 NOTE — MAU Note (Signed)
Bedside ultrasound vertex presentation confirmed per Gerrit Heck CNM

## 2020-10-08 NOTE — Progress Notes (Signed)
Labor Progress Note Carrie Sheppard is a 35 y.o. G4P0030 at [redacted]w[redacted]d presented for PROM S:  Sleeping comfortably. Felt a few of her contraction when she was awake but was able to go to sleep. She still wants to try and let things progress naturally. Discussed with patient natural ways to help her get into labor including walking, nipple stimulation and showering  O:  BP 131/74 (BP Location: Right Arm)   Pulse 73   Temp 98.1 F (36.7 C)   Resp 16   Ht 5\' 2"  (1.575 m)   Wt 73 kg   LMP 01/02/2020   BMI 29.45 kg/m  EFM: 135 bpm/moderate variability/+accels, no decels  CVE:  Deferred   A&P: 35 y.o. G4P0030 [redacted]w[redacted]d admitted for PROM #Labor: Expectant management, discussed walking and nipple stimulation to help patient start labor process naturally #Pain: Epidural as desired #FWB: Cat 1 #GBS negative   [redacted]w[redacted]d, MD 6:29 AM

## 2020-10-08 NOTE — Anesthesia Preprocedure Evaluation (Signed)
Anesthesia Evaluation  Patient identified by MRN, date of birth, ID band Patient awake    Reviewed: Allergy & Precautions, NPO status , Patient's Chart, lab work & pertinent test results  Airway Mallampati: II  TM Distance: >3 FB Neck ROM: Full    Dental  (+) Teeth Intact   Pulmonary neg pulmonary ROS, former smoker,    Pulmonary exam normal        Cardiovascular negative cardio ROS   Rhythm:Regular Rate:Normal     Neuro/Psych negative neurological ROS  negative psych ROS   GI/Hepatic negative GI ROS, Neg liver ROS,   Endo/Other  negative endocrine ROS  Renal/GU negative Renal ROS  negative genitourinary   Musculoskeletal negative musculoskeletal ROS (+)   Abdominal (+)  Abdomen: soft. Bowel sounds: normal.  Peds  Hematology negative hematology ROS (+)   Anesthesia Other Findings   Reproductive/Obstetrics (+) Pregnancy                             Anesthesia Physical Anesthesia Plan  ASA: 2  Anesthesia Plan: Epidural   Post-op Pain Management:    Induction:   PONV Risk Score and Plan: 2 and Treatment may vary due to age or medical condition  Airway Management Planned: Natural Airway  Additional Equipment: None  Intra-op Plan:   Post-operative Plan:   Informed Consent: I have reviewed the patients History and Physical, chart, labs and discussed the procedure including the risks, benefits and alternatives for the proposed anesthesia with the patient or authorized representative who has indicated his/her understanding and acceptance.     Dental advisory given  Plan Discussed with:   Anesthesia Plan Comments: (Lab Results      Component                Value               Date                      WBC                      11.1 (H)            10/08/2020                HGB                      11.0 (L)            10/08/2020                HCT                      33.6  (L)            10/08/2020                MCV                      84.4                10/08/2020                PLT                      277                 10/08/2020  Lab Results      Component                Value               Date                      NA                       140                 11/02/2019                K                        3.7                 11/02/2019                CO2                      24                  11/02/2019                GLUCOSE                  103 (H)             11/02/2019                BUN                      8                   11/02/2019                CREATININE               0.74                11/02/2019                CALCIUM                  9.2                 11/02/2019                GFRNONAA                 >60                 11/02/2019                GFRAA                    >60                 11/02/2019          )        Anesthesia Quick Evaluation

## 2020-10-08 NOTE — Progress Notes (Signed)
Marcelle Overlie RN caring for patient from [phone]

## 2020-10-08 NOTE — Anesthesia Procedure Notes (Signed)
Epidural Patient location during procedure: OB Start time: 10/08/2020 2:20 PM End time: 10/08/2020 2:29 PM  Staffing Anesthesiologist: Atilano Median, DO Performed: anesthesiologist   Preanesthetic Checklist Completed: patient identified, IV checked, site marked, risks and benefits discussed, surgical consent, monitors and equipment checked, pre-op evaluation and timeout performed  Epidural Patient position: sitting Prep: ChloraPrep Patient monitoring: heart rate, continuous pulse ox and blood pressure Approach: midline Location: L4-L5 Injection technique: LOR saline  Needle:  Needle type: Tuohy  Needle gauge: 17 G Needle length: 9 cm Needle insertion depth: 5 cm Catheter type: closed end flexible Catheter size: 20 Guage Catheter at skin depth: 9 cm Test dose: negative and 1.5% lidocaine  Assessment Events: blood not aspirated, injection not painful, no injection resistance and no paresthesia  Additional Notes Patient identified. Risks/Benefits/Options discussed with patient including but not limited to bleeding, infection, nerve damage, paralysis, failed block, incomplete pain control, headache, blood pressure changes, nausea, vomiting, reactions to medications, itching and postpartum back pain. Confirmed with bedside nurse the patient's most recent platelet count. Confirmed with patient that they are not currently taking any anticoagulation, have any bleeding history or any family history of bleeding disorders. Patient expressed understanding and wished to proceed. All questions were answered. Sterile technique was used throughout the entire procedure. Please see nursing notes for vital signs. Test dose was given through epidural catheter and negative prior to continuing to dose epidural or start infusion. Warning signs of high block given to the patient including shortness of breath, tingling/numbness in hands, complete motor block, or any concerning symptoms with  instructions to call for help. Patient was given instructions on fall risk and not to get out of bed. All questions and concerns addressed with instructions to call with any issues or inadequate analgesia.    Reason for block:procedure for pain

## 2020-10-08 NOTE — Progress Notes (Signed)
Doula here at bedside

## 2020-10-08 NOTE — Progress Notes (Signed)
Labor Progress Note Carrie Sheppard is a 35 y.o. G4P0030 at [redacted]w[redacted]d presented for SROM  S:  Patient comfortable. Requesting cervical exam to talk about augmentation  O:  BP 117/75   Pulse 86   Temp (!) 97.5 F (36.4 C) (Oral)   Resp 18   Ht 5\' 2"  (1.575 m)   Wt 73 kg   LMP 01/02/2020   BMI 29.45 kg/m   Fetal Tracing:  Baseline: 135 Variability: moderate Accels: 15x15 Decels: none  Toco: occasional uc's   CVE: Dilation: 2 Effacement (%): 50 Cervical Position: Posterior Station: -3 Presentation: Vertex Exam by:: C Niel CNM   A&P: 35 y.o. G4P0030 [redacted]w[redacted]d SROM #Labor: Methods of induction reviewed at length including cytotec and nipple stim. Patient desires cytotec at this time. Will give [redacted]w[redacted]d buccal cytotec and encouraged patient to still be ambulatory and changing positions.  #Pain: per patient request #FWB: Cat 1 #GBS negative   , CNM 10:18 AM

## 2020-10-08 NOTE — Progress Notes (Addendum)
Labor Progress Note Carrie Sheppard is a 35 y.o. G4P0030 at [redacted]w[redacted]d presented for SROM. S: Patient is feeling her contractions more strongly.  O:  BP 128/68   Pulse 82   Temp 98.2 F (36.8 C) (Oral)   Resp 19   Ht 5\' 2"  (1.575 m)   Wt 73 kg   LMP 01/02/2020   BMI 29.45 kg/m  EFM: baseline 140 BPM/+accels/-decels/mod variability Toco: contractions q3-4min  CVE: Dilation: 2 Effacement (%): 80 Cervical Position: Posterior Station: -3 Presentation: Vertex Exam by:: [email or web address] CNM   A&P: 35 y.o. G4P0030 [redacted]w[redacted]d presenting for SROM #Labor: Will give another dose of cytotec (x2). Recheck in four hours to evaluate if a possible FB is needed.  #Pain: prn-pt wants to try nitrous  #FWB: cat 1 #GBS negative   [redacted]w[redacted]d, MD [facility], [facility] 1:41 PM  Attestation of Supervision of Student:  I confirm that I have verified the information documented in the  resident  student's note and that I have also personally reperformed the history, physical exam and all medical decision making activities.  I have verified that all services and findings are accurately documented in this student's note; and I agree with management and plan as outlined in the documentation. I have also made any necessary editorial changes.   [facility], CNM [facility] Rol[facility]binder, [facility] 10/08/2020 7:42 PM

## 2020-10-08 NOTE — Progress Notes (Signed)
Patient wishes to wait as long as possible before getting an epidural . She wants to wait and see if her body labors naturally . She has decided to forego a cervical check at this time and get some rest and and have the option to eat and take a shower if she chooses.  MD will follow up after four hours.

## 2020-10-08 NOTE — H&P (Addendum)
OBSTETRIC ADMISSION HISTORY AND PHYSICAL  Carrie Sheppard is a 35 y.o. female G83P0030 with IUP at [redacted]w[redacted]d by LMP presenting for PROM. Felt large gush of fluid 7/29 @ 2300 with a continued slow leak. Not feeling contractions. She reports +FMs, No VB, no blurry vision, headaches or peripheral edema, and RUQ pain.  She plans on breast feeding. She request condoms for birth control. She received her prenatal care at [facility]   Dating: By LMP --->  Estimated Date of Delivery: 10/08/20  Sono:    @[redacted]w[redacted]d , CWD, normal anatomy, cephalic presentation, Anterior fundal placenta, 643g, 21% EFW   Prenatal History/Complications:  None   Past Medical History: Past Medical History:  Diagnosis Date   Medical history non-contributory    Tobacco abuse 12/12/2018    Past Surgical History: Past Surgical History:  Procedure Laterality Date   NO PAST SURGERIES      Obstetrical History: OB History     Gravida  4   Para      Term      Preterm      AB  3   Living         SAB  1   IAB  2   Ectopic      Multiple      Live Births              Social History Social History   Socioeconomic History   Marital status: Married    Spouse name: Not on file   Number of children: Not on file   Years of education: Not on file   Highest education level: Not on file  Occupational History   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  Tobacco Use   Smoking status: Former    Packs/day: 0.50    Years: 15.00    Pack years: 7.50    Types: Cigarettes   Smokeless tobacco: Never   Tobacco comments:    quit 3 weeks ago  Vaping Use   Vaping Use: Never used  Substance and Sexual Activity   Alcohol use: Not Currently    Comment: wine almost every day   Drug use: Not Currently   Sexual activity: Yes    Partners: Male    Birth control/protection: None  Other Topics Concern   Not on file  Social History Narrative   Works in the 02/11/2019   Moved to [place] from [place]   No  children   Has a dog   Enjoys netflix, some hiking    Social Determinants of [place] Strain: Not on file  Food Insecurity: Not on file  Transportation Needs: Not on file  Physical Activity: Not on file  Stress: Not on file  Social Connections: Not on file    Family History: Family History  Problem Relation Age of Onset   Diabetes Brother        1/2 brother   Diabetes Sister        1/2 sister    Allergies: No Known Allergies  Medications Prior to Admission  Medication Sig Dispense Refill Last Dose   pantoprazole (PROTONIX) 40 MG tablet Take 1 tablet (40 mg total) by mouth daily. 90 tablet 1 10/07/2020   Prenatal Vit-Fe Fumarate-FA (PRENATAL VITAMIN) 27-0.8 MG TABS Take 1 tablet by mouth daily. 30 tablet  10/07/2020   calcium carbonate (TUMS - DOSED IN MG ELEMENTAL CALCIUM) 500 MG chewable tablet Chew 1 tablet by mouth daily. (Patient not taking: No sig reported)  Review of Systems   All systems reviewed and negative except as stated in HPI  Blood pressure 123/73, pulse 88, temperature 98 F (36.7 C), temperature source Oral, resp. rate 20, height 5\' 2"  (1.575 m), weight 73 kg, last menstrual period 01/02/2020, unknown if currently breastfeeding. General appearance: alert, cooperative, and appears stated age Lungs: clear to auscultation bilaterally Heart: regular rate and rhythm Abdomen: soft, non-tender; bowel sounds normal Pelvic: deferred Extremities: Homans sign is negative, no sign of DVT Presentation: cephalic Fetal monitoringBaseline: 125 bpm, Variability: Good {> 6 bpm), Accelerations: Reactive, and Decelerations: Absent Uterine activityNone     Prenatal labs: ABO, Rh: A/Positive/-- (12/08 1456) Antibody: Negative (12/08 1456) Rubella: 6.61 (12/08 1456) RPR: Non Reactive (04/27 0856)  HBsAg: Negative (12/08 1456)  HIV: Non Reactive (04/27 0856)  GBS: Negative/-- (07/06 1155)  2 hr Glucola: 81, 135, 142 Genetic screening: Low  Risk Anatomy 07[birth year]1993: Normal  Prenatal Transfer Tool  Maternal Diabetes: No Genetic Screening: Normal Maternal Ultrasounds/Referrals: Normal Fetal Ultrasounds or other Referrals:  None Maternal Substance Abuse:  No Significant Maternal Medications:  None Significant Maternal Lab Results: Group B Strep negative  Results for orders placed or performed during the hospital encounter of 10/08/20 (from the past 24 hour(s))  POCT fern test   Collection Time: 10/08/20  2:00 AM  Result Value Ref Range   POCT Fern Test Positive = ruptured amniotic membanes     Patient Active Problem List   Diagnosis Date Noted   Supervision of other normal pregnancy, antepartum 02/17/2020    Assessment/Plan:  Carrie Sheppard is a 35 y.o. G4P0030 at [redacted]w[redacted]d here for PROM  #Labor:Not currently in labor, PROM so will expectantly manage and see if patient spontaneously goes into labor #Pain: Epidural as desired, nitrous oxide PRN #FWB: Cat 1 #ID:  GBS negative #MOF: Breast #MOC:condoms #Circ: NA, Female  [redacted]w[redacted]d, MD  10/08/2020, 2:02 AM  Midwife attestation: I have seen and examined this patient; I agree with above documentation in the resident's note.   Carrie Sheppard is a 36 y.o. G4P0030 here for PROM without onset of active labor.  PE: BP 117/75   Pulse 86   Temp (!) 97.5 F (36.4 C) (Oral)   Resp 18   Ht 5\' 2"  (1.575 m)   Wt 161 lb (73 kg)   LMP 01/02/2020   BMI 29.45 kg/m  Gen: calm comfortable, NAD Resp: normal effort, no distress Abd: gravid  ROS, labs, PMH reviewed  Plan: Admit to LD Labor: expectant management x 4 hours then reassess and discuss options with patient. Fetal monitoring: Category I ID: GBS negative  , CNM  10/08/2020, 10:02 AM

## 2020-10-08 NOTE — Progress Notes (Signed)
Labor Progress Note Carrie Sheppard is a 35 y.o. G4P0030 at [redacted]w[redacted]d presented for SROM. S: Patient feeling more pressure in her bottom, not really feeling her contractions. Her Doula is at bedside  O:  BP (!) 118/56   Pulse 83   Temp 98.8 F (37.1 C) (Oral)   Resp 20   Ht 5\' 2"  (1.575 m)   Wt 73 kg   LMP 01/02/2020   SpO2 98%   BMI 29.45 kg/m  EFM: 140 BPM/mod variability/+ accels/moderate decels to 90 but toco not tracing well Toco: poor tracing  CVE: Dilation: 10 Dilation Complete Date: 10/08/20 Dilation Complete Time: 2305 Effacement (%): 100 Cervical Position: Posterior Station: Plus 1 Presentation: Vertex Exam by:: Dr. [email or web address]   A&P: 35 y.o. G4P0030 [redacted]w[redacted]d presenting for SROM #Labor: Cytotec (x2). Making progress appropriately, now complete but with infrequent contractions if laboring down in the next hour is not effective will discuss starting pitocin  #Pain: Epidural in place  #FWB: cat 2 but reassuring with good variability, anticipate NSVD #GBS negative  [redacted]w[redacted]d, MD 10/08/2020 11:11 PM

## 2020-10-08 NOTE — Progress Notes (Signed)
Labor Progress Note Keelan Pomerleau is a 35 y.o. G4P0030 at [redacted]w[redacted]d presented for SROM. S: Patient is feeling her contractions but has been able to sleep through them with her epidural  O:  BP 130/77   Pulse 86   Temp 99.1 F (37.3 C) (Oral)   Resp 16   Ht 5\' 2"  (1.575 m)   Wt 73 kg   LMP 01/02/2020   SpO2 99%   BMI 29.45 kg/m  EFM: 140 BPM/+ accels/Variable and early decels/mod variability Toco: contractions q3-78min  CVE: Dilation: 6.5 Effacement (%): 100 Cervical Position: Posterior Station: 0 Presentation: Vertex Exam by:: Dr. [email or web address]   A&P: 36 y.o. G4P0030 [redacted]w[redacted]d presenting for SROM #Labor: Cytotec (x2). Making progress appropriately, will hold off on pitocin at this time  #Pain: Epidural in place  #FWB: cat 2 but reassuring with good variability #GBS negative   [redacted]w[redacted]d, MD 10/08/2020 8:48 PM

## 2020-10-08 NOTE — MAU Note (Signed)
PT SAYS AT 2300- FIRST GUSH OF FLUID- FLUID CONTINUES  PNC WITH HP-   VE ON WED- 2 CM DENIES HSV   GBS- NEG LAST SEX- Tuesday SOME MILD UC'S

## 2020-10-09 DIAGNOSIS — O4202 Full-term premature rupture of membranes, onset of labor within 24 hours of rupture: Secondary | ICD-10-CM

## 2020-10-09 DIAGNOSIS — Z3A40 40 weeks gestation of pregnancy: Secondary | ICD-10-CM

## 2020-10-09 MED ORDER — IBUPROFEN 600 MG PO TABS
600.0000 mg | ORAL_TABLET | Freq: Four times a day (QID) | ORAL | Status: DC
  Administered 2020-10-09 – 2020-10-11 (×8): 600 mg via ORAL
  Filled 2020-10-09 (×9): qty 1

## 2020-10-09 MED ORDER — SIMETHICONE 80 MG PO CHEW: 80.0000 mg | CHEWABLE_TABLET | ORAL | Status: DC | PRN

## 2020-10-09 MED ORDER — WITCH HAZEL-GLYCERIN EX PADS: 1.0000 "application " | MEDICATED_PAD | CUTANEOUS | Status: DC | PRN

## 2020-10-09 MED ORDER — COCONUT OIL OIL: 1.0000 "application " | TOPICAL_OIL | Status: DC | PRN

## 2020-10-09 MED ORDER — ONDANSETRON HCL 4 MG/2ML IJ SOLN: 4.0000 mg | INTRAMUSCULAR | Status: DC | PRN

## 2020-10-09 MED ORDER — BENZOCAINE-MENTHOL 20-0.5 % EX AERO
1.0000 "application " | INHALATION_SPRAY | CUTANEOUS | Status: DC | PRN
  Administered 2020-10-09: 1 via TOPICAL
  Filled 2020-10-09: qty 56

## 2020-10-09 MED ORDER — PRENATAL MULTIVITAMIN CH
1.0000 | ORAL_TABLET | Freq: Every day | ORAL | Status: DC
  Administered 2020-10-09 – 2020-10-10 (×2): 1 via ORAL
  Filled 2020-10-09 (×2): qty 1

## 2020-10-09 MED ORDER — ACETAMINOPHEN 325 MG PO TABS
650.0000 mg | ORAL_TABLET | ORAL | Status: DC | PRN
  Administered 2020-10-09: 650 mg via ORAL
  Filled 2020-10-09: qty 2

## 2020-10-09 MED ORDER — ACETAMINOPHEN 500 MG PO TABS
1000.0000 mg | ORAL_TABLET | Freq: Once | ORAL | Status: AC
  Administered 2020-10-09: 1000 mg via ORAL
  Filled 2020-10-09: qty 2

## 2020-10-09 MED ORDER — ONDANSETRON HCL 4 MG PO TABS: 4.0000 mg | ORAL_TABLET | ORAL | Status: DC | PRN

## 2020-10-09 MED ORDER — ZOLPIDEM TARTRATE 5 MG PO TABS: 5.0000 mg | ORAL_TABLET | Freq: Every evening | ORAL | Status: DC | PRN

## 2020-10-09 MED ORDER — TETANUS-DIPHTH-ACELL PERTUSSIS 5-2.5-18.5 LF-MCG/0.5 IM SUSY: 0.5000 mL | PREFILLED_SYRINGE | Freq: Once | INTRAMUSCULAR | Status: DC

## 2020-10-09 MED ORDER — DIBUCAINE (PERIANAL) 1 % EX OINT: 1.0000 "application " | TOPICAL_OINTMENT | CUTANEOUS | Status: DC | PRN

## 2020-10-09 MED ORDER — DIPHENHYDRAMINE HCL 25 MG PO CAPS: 25.0000 mg | ORAL_CAPSULE | Freq: Four times a day (QID) | ORAL | Status: DC | PRN

## 2020-10-09 MED ORDER — DOCUSATE SODIUM 100 MG PO CAPS: 100.0000 mg | ORAL_CAPSULE | Freq: Two times a day (BID) | ORAL | Status: DC

## 2020-10-09 MED ORDER — MEASLES, MUMPS & RUBELLA VAC IJ SOLR: 0.5000 mL | Freq: Once | INTRAMUSCULAR | Status: DC

## 2020-10-09 MED ORDER — SENNOSIDES-DOCUSATE SODIUM 8.6-50 MG PO TABS
2.0000 | ORAL_TABLET | Freq: Every day | ORAL | Status: DC
  Administered 2020-10-10: 2 via ORAL
  Filled 2020-10-09: qty 2

## 2020-10-09 NOTE — Lactation Note (Addendum)
This note was copied from a baby's chart. Lactation Consultation Note  Patient Name: Carrie Sheppard [id number] Date: 10/09/2020 Reason for consult: Initial assessment;1st time breastfeeding;Term Age:34 hours. Term female infant with one void and one stool since birth. Per mom, infant is still spitty and  had  emesis episode ( clear mucous) 1 hour ago. Mom will start burping infant after BF infant. Breastfeeding is improving infant recently BF for 20 minutes at 1630, LC did not observe latch. Mom knows to hand express and give infant back volume by spoon if infant doesn't latch. Mom is comfortable using the football hold position Mom knows to BF infant according to hunger cues, 8 to 12 or more times within 24 hours, lots of STS. Mom knows to call RN or LC if she has BF questions, concerns or needs assistance with latching infant at the breast. LC discussed infant's input and output with parents. Mom was  given hand pump  per mom request for prn ( home use), she does have DEBP at home.  Mom made aware of O/P services, breastfeeding support groups, community resources, and our phone # for post-discharge questions.    Maternal Data Has patient been taught Hand Expression?: Yes Does the patient have breastfeeding experience prior to this delivery?: No  Feeding Mother's Current Feeding Choice: Breast Milk  LATCH Score    Lactation Tools Discussed/Used Tools: Pump Breast pump type: Manual Pump Education: Setup, frequency, and cleaning;Milk Storage Reason for Pumping: Mom request for prn use .  Interventions Interventions: Breast feeding basics reviewed;Skin to skin;Hand express;Position options;Expressed milk;Education;Hand pump  Discharge Pump: Manual;Personal WIC Program: No  Consult Status Consult Status: Follow-up Date: 10/10/20 Follow-up type: In-patient    Danelle Earthly 10/09/2020, 5:57 PM

## 2020-10-09 NOTE — Discharge Summary (Signed)
Postpartum Discharge Summary     Patient Name: Carrie Sheppard DOB: [birth year] MRN: [id number]  Date of admission: 10/08/2020 Delivery date:10/09/2020  Delivering provider: Waldon Merl  Date of discharge: 10/11/2020  Admitting diagnosis: PROM (premature rupture of membranes) [O42.90] Intrauterine pregnancy: [redacted]w[redacted]d    Secondary diagnosis:  Active Problems:   PROM (premature rupture of membranes)   Third degree perineal laceration during delivery  Additional problems: N/A    Discharge diagnosis: Term Pregnancy Delivered                                              Post partum procedures: N/A Augmentation: Cytotec Complications: 3c lac  Hospital course: Onset of Labor With Vaginal Delivery      35 y.o. yo G4P0030 at 480w3das admitted in Latent Labor on 10/08/2020. Patient had an uncomplicated labor course as follows:  Membrane Rupture Time/Date: 11:00 PM ,10/07/2020   Delivery Method:Vaginal, Spontaneous  Episiotomy: None  Lacerations:  3rd degree  Patient had an uncomplicated postpartum course.  She is ambulating, tolerating a regular diet, passing flatus, and urinating well. Patient is discharged home in stable condition on 10/11/20.  Newborn Data: Birth date:10/09/2020  Birth time:1:53 AM  Gender:Female  Living status:Living  Apgars:9 ,9  Weight:3050 g (6lb 11.6oz)  Magnesium Sulfate received: No BMZ received: No Rhophylac:No MMR:N/A T-DaP:Given prenatally Flu: N/A Transfusion:No  Physical exam  Vitals:   10/10/20 0549 10/10/20 1547 10/10/20 2123 10/11/20 0555  BP: 102/60 109/65 (!) 105/55 121/68  Pulse: 80 77 75 64  Resp: '16 17 16 16  ' Temp: 98.6 F (37 C) 98 F (36.7 C) 98.5 F (36.9 C) 97.8 F (36.6 C)  TempSrc: Oral Oral Oral Oral  SpO2: 98%  98% 98%  Weight:      Height:       General: alert, cooperative, and no distress Lochia: appropriate Uterine Fundus: firm Incision: N/A DVT Evaluation: No evidence of DVT seen on physical  exam. Labs: Lab Results  Component Value Date   WBC 11.1 (H) 10/08/2020   HGB 11.0 (L) 10/08/2020   HCT 33.6 (L) 10/08/2020   MCV 84.4 10/08/2020   PLT 277 10/08/2020   CMP Latest Ref Rng & Units 11/02/2019  Glucose 70 - 99 mg/dL 103(H)  BUN 6 - 20 mg/dL 8  Creatinine 0.44 - 1.00 mg/dL 0.74  Sodium 135 - 145 mmol/L 140  Potassium 3.5 - 5.1 mmol/L 3.7  Chloride 98 - 111 mmol/L 108  CO2 22 - 32 mmol/L 24  Calcium 8.9 - 10.3 mg/dL 9.2  Total Protein 6.5 - 8.1 g/dL 7.5  Total Bilirubin 0.3 - 1.2 mg/dL 0.6  Alkaline Phos 38 - 126 U/L 52  AST 15 - 41 U/L 12(L)  ALT 0 - 44 U/L 11   Edinburgh Score: Edinburgh Postnatal Depression Scale Screening Tool 10/09/2020  I have been able to laugh and see the funny side of things. 0  I have looked forward with enjoyment to things. 0  I have blamed myself unnecessarily when things went wrong. 1  I have been anxious or worried for no good reason. 2  I have felt scared or panicky for no good reason. 2  Things have been getting on top of me. 1  I have been so unhappy that I have had difficulty sleeping. 0  I have felt sad or miserable. 1  I have been so unhappy that I have been crying. 0  The thought of harming myself has occurred to me. 0  Edinburgh Postnatal Depression Scale Total 7     After visit meds:  Allergies as of 10/11/2020   No Known Allergies      Medication List     STOP taking these medications    calcium carbonate 500 MG chewable tablet Commonly known as: TUMS - dosed in mg elemental calcium       TAKE these medications    acetaminophen 325 MG tablet Commonly known as: Tylenol Take 2 tablets (650 mg total) by mouth every 4 (four) hours as needed (for pain scale < 4).   benzocaine-Menthol 20-0.5 % Aero Commonly known as: DERMOPLAST Apply 1 application topically as needed for irritation (perineal discomfort).   coconut oil Oil Apply 1 application topically as needed.   ibuprofen 600 MG tablet Commonly known  as: ADVIL Take 1 tablet (600 mg total) by mouth every 6 (six) hours.   pantoprazole 40 MG tablet Commonly known as: Protonix Take 1 tablet (40 mg total) by mouth daily.   Prenatal Vitamin 27-0.8 MG Tabs Take 1 tablet by mouth daily.         Discharge home in stable condition Infant Feeding: Breast Infant Disposition:home with mother Discharge instruction: per After Visit Summary and Postpartum booklet. Activity: Advance as tolerated. Pelvic rest for 6 weeks.  Diet: routine diet Future Appointments: Future Appointments  Date Time Provider [place]  10/21/2020 10:15 AM Truett Mainland, DO [facility] None  11/11/2020 10:15 AM Nehemiah Settle Tanna Savoy, DO [facility] None   Follow up Visit: Message sent by [facility], CNM 10/09/2020  Please schedule this patient for a In person postpartum visit in 4 weeks with the following provider: Any provider. Additional Postpartum F/U: Needs 1 week f/u for third degree lac   Low risk pregnancy complicated by:  N/A Delivery mode:  Vaginal, Spontaneous  Anticipated Birth Control:  Condoms   10/11/2020 Erskine Emery, MD  CNM attestation I have seen and examined this patient and agree with above documentation in the resident's note.   Carrie Sheppard is a 35 y.o. G4P0030 s/p vag del with 3c lac.   Pain is well controlled.  Plan for birth control is condoms.  Method of Feeding: breast  PE:  BP 121/68 (BP Location: Left Arm)   Pulse 64   Temp 97.8 F (36.6 C) (Oral)   Resp 16   Ht '5\' 2"'  (1.575 m)   Wt 73 kg   LMP 01/02/2020   SpO2 98%   BMI 29.45 kg/m  Fundus firm  No results for input(s): HGB, HCT in the last 72 hours.   Plan: discharge today - postpartum care discussed - f/u clinic in 1 week for lac healing assessment; 4 weeks for postpartum visit   Myrtis Ser, CNM 10:51 AM 10/11/2020

## 2020-10-09 NOTE — Lactation Note (Signed)
This note was copied from a baby's chart. Lactation Consultation Note  Patient Name: Carrie Sheppard Today's Date: 10/09/2020 Reason for consult: Initial assessment;Term;Other (Comment) (baby last fed at 0840 - asleep / woke up for short interval and was spitty - clear / bulb x 1) Age:34 hours LC entered the room , baby lying in the crib and noted the baby to be gaggy and spitty. LC burped the baby and help upright until it cleared , bulb syringe x 1 .  Baby STS and latched for short interval suckled for 3 mins / stayed latched for 10 mins . LC reviewed breast feeding basics and what to expect on the 1st few days.  Baby has voided , no stool as of yet.  Mom aware to page when baby is showing feeding cues.  Latch of 7    LC provided the [facility] resource brochure with phone numbers and BFSG .  Maternal Data Has patient been taught Hand Expression?: Yes  Feeding Mother's Current Feeding Choice: Breast Milk  LATCH Score Latch: Grasps breast easily, tongue down, lips flanged, rhythmical sucking.  Audible Swallowing: None  Type of Nipple: Everted at rest and after stimulation (areola edema)  Comfort (Breast/Nipple): Soft / non-tender  Hold (Positioning): Assistance needed to correctly position infant at breast and maintain latch.  LATCH Score: 7   Lactation Tools Discussed/Used    Interventions Interventions: Breast feeding basics reviewed;Assisted with latch;Skin to skin;Breast massage;Hand express;Reverse pressure;Breast compression;Adjust position;Support pillows;Position options;Education  Discharge Pump: Personal;DEBP  Consult Status Consult Status: Follow-up Date: 10/09/20 Follow-up type: In-patient    Matilde Sprang Marquita Lias 10/09/2020, 10:15 AM

## 2020-10-09 NOTE — Anesthesia Postprocedure Evaluation (Signed)
Anesthesia Post Note  Patient: Carrie Sheppard  Procedure(s) Performed: AN AD HOC LABOR EPIDURAL     Patient location during evaluation: Mother Baby Anesthesia Type: Epidural Level of consciousness: awake and alert Pain management: pain level controlled Vital Signs Assessment: post-procedure vital signs reviewed and stable Respiratory status: spontaneous breathing, nonlabored ventilation and respiratory function stable Cardiovascular status: stable Postop Assessment: no headache, no backache and epidural receding Anesthetic complications: no   No notable events documented.  Last Vitals:  Vitals:   10/09/20 1113 10/09/20 1427  BP: 108/63 (!) 103/56  Pulse: 67 68  Resp: 17 16  Temp: 36.5 C 36.7 C  SpO2: 99% 100%    Last Pain:  Vitals:   10/09/20 1622  TempSrc:   PainSc: 0-No pain   Pain Goal:                   Rica Records

## 2020-10-10 MED ORDER — DOCUSATE SODIUM 100 MG PO CAPS
100.0000 mg | ORAL_CAPSULE | Freq: Two times a day (BID) | ORAL | Status: DC
  Administered 2020-10-10 – 2020-10-11 (×2): 100 mg via ORAL
  Filled 2020-10-10 (×2): qty 1

## 2020-10-10 MED ORDER — DOCUSATE SODIUM 100 MG PO CAPS: 300.0000 mg | ORAL_CAPSULE | Freq: Every day | ORAL | Status: DC

## 2020-10-10 MED ORDER — POLYETHYLENE GLYCOL 3350 17 G PO PACK
17.0000 g | PACK | Freq: Every day | ORAL | Status: DC
  Administered 2020-10-10: 17 g via ORAL
  Filled 2020-10-10: qty 1

## 2020-10-10 NOTE — Lactation Note (Signed)
This note was copied from a baby's chart. Lactation Consultation Note  Patient Name: Carrie Sheppard [id number] Date: 10/10/2020 Reason for consult: Follow-up assessment;1st time breastfeeding;Primapara;Term;Infant weight loss;Other (Comment) (5 % weight loss /) Age:34 hours Per mom baby has been cluster feeding this am.  Mom mentioned the baby had a diaper that needed to be change.  LC offered and changed a med to large transitional stool.  LC assisted to re-latch on the right breast / football / with Latch of 8.  Per mom comfortable and depth obtained.  Mom denies soreness.  LC reviewed and updated the doc flow sheets this am.   Maternal Data    Feeding Mother's Current Feeding Choice: Breast Milk  LATCH Score Latch: Grasps breast easily, tongue down, lips flanged, rhythmical sucking.  Audible Swallowing: A few with stimulation  Type of Nipple: Everted at rest and after stimulation  Comfort (Breast/Nipple): Soft / non-tender  Hold (Positioning): Assistance needed to correctly position infant at breast and maintain latch.  LATCH Score: 8   Lactation Tools Discussed/Used Tools: Pump;Flanges Flange Size: 24 Breast pump type: Manual Pump Education: Milk Storage  Interventions Interventions: Breast feeding basics reviewed;Assisted with latch;Skin to skin;Breast compression;Adjust position;Support pillows;Education  Discharge Pump: Personal;Manual;DEBP  Consult Status Consult Status: Follow-up Date: 10/11/20 Follow-up type: In-patient    Carrie Sheppard 10/10/2020, 8:49 AM

## 2020-10-10 NOTE — Progress Notes (Addendum)
POSTPARTUM PROGRESS NOTE  Subjective: Carrie Sheppard is a 35 y.o. G4P0030 s/p PPD#1 SVD.  She reports she doing well. No acute events overnight. She denies any problems with ambulating, voiding or po intake. Denies nausea or vomiting. She has passed flatus. Pain is moderately controlled, pt reports that she is just sore.  Lochia is appropriate.  Objective: Blood pressure 102/60, pulse 80, temperature 98.6 F (37 C), temperature source Oral, resp. rate 16, height 5\' 2"  (1.575 m), weight 73 kg, last menstrual period 01/02/2020, SpO2 98 %, unknown if currently breastfeeding.  Physical Exam:  General: alert, cooperative and no distress Chest: no respiratory distress Abdomen: soft, non-tender  Uterine Fundus: firm and at level of umbilicus Extremities: No calf swelling or tenderness  No edema  Recent Labs    10/08/20 0217  HGB 11.0*  HCT 33.6*    Assessment/Plan: Carrie Sheppard is a 35 y.o. G4P0030 s/p PPD#1 SVD.  Routine Postpartum Care: Doing well, pain well-controlled.  -- Continue routine care, lactation support  -- Contraception: condoms  -- Feeding: breast -- Pt is doing well this morning  --3c lac-no complaints --Would like to stay another night because baby has to stay  Dispo: Plan for discharge tomorrow-infant indication and 3c lac.  20, MD Faculty Practice, [facility] 10/10/2020 6:33 AM  Attestation of Supervision of Student:  I confirm that I have verified the information documented in the  resident  student's note and that I have also personally reperformed the history, physical exam and all medical decision making activities.  I have verified that all services and findings are accurately documented in this student's note; and I agree with management and plan as outlined in the documentation. I have also made any necessary editorial changes. -start bowel regimen today  12/10/2020, CNM [facility] 10/10/2020 6:41 AM

## 2020-10-11 MED ORDER — ACETAMINOPHEN 325 MG PO TABS: 650.0000 mg | ORAL_TABLET | ORAL | Status: DC | PRN

## 2020-10-11 MED ORDER — IBUPROFEN 600 MG PO TABS: 600.0000 mg | ORAL_TABLET | Freq: Four times a day (QID) | ORAL | 0 refills | Status: DC

## 2020-10-11 MED ORDER — BENZOCAINE-MENTHOL 20-0.5 % EX AERO: 1.0000 "application " | INHALATION_SPRAY | CUTANEOUS | Status: DC | PRN

## 2020-10-11 MED ORDER — COCONUT OIL OIL: 1.0000 "application " | TOPICAL_OIL | 0 refills | Status: AC | PRN

## 2020-10-11 NOTE — Lactation Note (Addendum)
This note was copied from a baby's chart. Lactation Consultation Note  Patient Name: Carrie Sheppard [id number] Date: 10/11/2020 Reason for consult: Follow-up assessment Age:34 hours P1 Infant at 9% weight loss this am. Mother assist with latching infant . Infant latching only on the nipple. She was assisted with flanging infants lips for wider gape. Mother has long nipples for infants mouth. Suggested getting more of breast in infants mouth from the beginning of the latch and flanging infants lips, then hugging infant close.  Mother has a DEBP at home. Discussed pumping .  Mother has a Doula that is a IBCLC that plans to come to her home to do a follow up visit.  Discussed treatment and prevention of engorgement.  Encouraged cue base and cluster feeding.  Mother is aware of available LC services.   Maternal Data    Feeding Mother's Current Feeding Choice: Breast Milk  LATCH Score Latch: Grasps breast easily, tongue down, lips flanged, rhythmical sucking.  Audible Swallowing: A few with stimulation  Type of Nipple: Everted at rest and after stimulation (semi-flat when I compress)  Comfort (Breast/Nipple): Soft / non-tender (mother reports that breast are filling slightly)  Hold (Positioning): Assistance needed to correctly position infant at breast and maintain latch.  LATCH Score: 8   Lactation Tools Discussed/Used    Interventions Interventions: Breast feeding basics reviewed;Assisted with latch;Breast massage;Hand express;Adjust position;Support pillows;Position options  Discharge Discharge Education: Engorgement and breast care;Warning signs for feeding baby;Outpatient recommendation (mother with see LC at home next week)  Consult Status Consult Status: Complete    Michel Bickers 10/11/2020, 9:26 AM

## 2020-10-21 ENCOUNTER — Other Ambulatory Visit: Payer: Self-pay

## 2020-10-21 ENCOUNTER — Ambulatory Visit (INDEPENDENT_AMBULATORY_CARE_PROVIDER_SITE_OTHER): Payer: Commercial Managed Care - PPO | Admitting: Family Medicine

## 2020-10-21 ENCOUNTER — Encounter: Payer: Self-pay | Admitting: Family Medicine

## 2020-10-21 VITALS — BP 130/90 | HR 93

## 2020-10-21 DIAGNOSIS — Z4889 Encounter for other specified surgical aftercare: Secondary | ICD-10-CM

## 2020-10-21 NOTE — Progress Notes (Signed)
Patient presents for postpartum perineum check - 12 days postpartum. Armandina Stammer RN   Patient is breastfeeding.

## 2020-10-21 NOTE — Progress Notes (Signed)
   Subjective:    Patient ID: Carrie Sheppard, female    DOB: March 16, 1985, 35 y.o.   MRN: [id number]  HPI Patient approximately week and a half postpartum from a vaginal delivery with a 3c laceration and repair.  She currently reports that things are healing well.  She did experience a fair amount of tenderness initially, but this is improving.   Review of Systems     Objective:   Physical Exam Vitals reviewed. Exam conducted with a chaperone present.  Constitutional:      Appearance: Normal appearance.  Abdominal:     Hernia: There is no hernia in the left inguinal area or right inguinal area.  Genitourinary:    Labia:        Right: No rash or tenderness.        Left: No rash or tenderness.   Lymphadenopathy:     Lower Body: No right inguinal adenopathy. No left inguinal adenopathy.  Neurological:     Mental Status: She is alert.  Psychiatric:        Mood and Affect: Mood normal.        Behavior: Behavior normal.        Thought Content: Thought content normal.          Assessment & Plan:   1. Encounter for post surgical wound check Appears to be healing well.

## 2020-10-22 ENCOUNTER — Telehealth (HOSPITAL_COMMUNITY): Payer: Self-pay

## 2020-10-22 NOTE — Telephone Encounter (Signed)
"  I'm doing good." Patient has no questions or concerns about her healing.  "She is good. She is eating all the time. She sleeps in a bassinet next to my bed."RN reviewed ABC's of safe sleep with patient. Patient declines any questions or concerns about baby.  EPDS score is 2.  Marcelino Duster [facility] 10/22/2020,1041

## 2020-11-11 ENCOUNTER — Encounter: Payer: Self-pay | Admitting: Family Medicine

## 2020-11-11 ENCOUNTER — Other Ambulatory Visit: Payer: Self-pay

## 2020-11-11 ENCOUNTER — Ambulatory Visit (INDEPENDENT_AMBULATORY_CARE_PROVIDER_SITE_OTHER): Payer: Commercial Managed Care - PPO | Admitting: Family Medicine

## 2020-11-11 NOTE — Progress Notes (Signed)
Post Partum Visit Note  Carrie Sheppard is a 35 y.o. G66P0030 female who presents for a postpartum visit. She is 4 weeks postpartum following a normal spontaneous vaginal delivery.  I have fully reviewed the prenatal and intrapartum course. The delivery was at [redacted]w[redacted]d gestational weeks.  Anesthesia: epidural. Postpartum course has been uncomplicated. Baby is doing well. Baby is feeding by breast. Bleeding staining only. Bowel function is normal. Bladder function is normal. Patient is not sexually active. Contraception method is none. Postpartum depression screening: negative.   The pregnancy intention screening data noted above was reviewed. Potential methods of contraception were discussed. The patient elected to proceed with No data recorded.   Edinburgh Postnatal Depression Scale - 11/11/20 1016       Edinburgh Postnatal Depression Scale:  In the Past 7 Days   I have been able to laugh and see the funny side of things. 0    I have looked forward with enjoyment to things. 0    I have blamed myself unnecessarily when things went wrong. 0    I have been anxious or worried for no good reason. 0    I have felt scared or panicky for no good reason. 0    Things have been getting on top of me. 0    I have been so unhappy that I have had difficulty sleeping. 0    I have felt sad or miserable. 0    I have been so unhappy that I have been crying. 0    The thought of harming myself has occurred to me. 0    Edinburgh Postnatal Depression Scale Total 0             Health Maintenance Due  Topic Date Due   COVID-19 Vaccine (3 - Booster for Moderna series) 11/30/2019   INFLUENZA VACCINE  10/10/2020    The following portions of the patient's history were reviewed and updated as appropriate: allergies, current medications, past family history, past medical history, past social history, past surgical history, and problem list.  Review of Systems Pertinent items are noted in HPI.  Objective:   BP (!) 136/92   Pulse 81   Ht 5\' 2"  (1.575 m)   Wt 141 lb (64 kg)   LMP 01/02/2020   Breastfeeding Yes   BMI 25.79 kg/m    General:  alert, cooperative, and no distress   Breasts:  not indicated  Lungs: clear to auscultation bilaterally  Heart:  regular rate and rhythm, S1, S2 normal, no murmur, click, rub or gallop  Abdomen: soft, non-tender; bowel sounds normal; no masses,  no organomegaly   Wound N/a  GU exam:  not indicated       Assessment:    1. Postpartum exam   Plan:   Essential components of care per ACOG recommendations:  1.  Mood and well being: Patient with negative depression screening today. Reviewed local resources for support.  - 2. Infant care and feeding:  -Patient currently breastmilk feeding? Yes. Reviewed importance of draining breast regularly to support lactation.  -Social determinants of health (SDOH) reviewed in EPIC. No concerns  3. Sexuality, contraception and birth spacing - Patient does not want a pregnancy in the next year.   - Reviewed forms of contraception in tiered fashion. Patient desired condoms today.   - Discussed birth spacing of 18 months  4. Sleep and fatigue -Encouraged family/partner/community support of 4 hrs of uninterrupted sleep to help with mood and fatigue  5.  Physical Recovery  - Discussed patients delivery and complications. She describes her labor as good. - Patient had a Vaginal, no problems at delivery. Patient had a 3rd degree laceration. Perineal healing reviewed. Patient expressed understanding - Patient has urinary incontinence? No. - Patient is safe to resume physical and sexual activity  6.  Health Maintenance - HM due items addressed Yes - Last pap smear  Diagnosis  Date Value Ref Range Status  03/20/2019   Final   - Negative for intraepithelial lesion or malignancy (NILM)   Pap smear not done at today's visit.  -Breast Cancer screening indicated? No.   7. Chronic Disease/Pregnancy Condition follow  up: None  - PCP follow up  Levie Heritage, DO [facility]

## 2021-11-17 IMAGING — US US OB < 14 WEEKS - US OB TV
1 series · 15 of 28 positions shown · non-contrast
Comparison: None.

CLINICAL DATA: First trimester of pregnancy, vaginal bleeding.

EXAM:
OBSTETRIC <14 WK US AND TRANSVAGINAL OB US
TECHNIQUE: Both transabdominal and transvaginal ultrasound examinations were
performed for complete evaluation of the gestation as well as the
maternal uterus, adnexal regions, and pelvic cul-de-sac.
Transvaginal technique was performed to assess early pregnancy.

[Series 1: us ob < 14 weeks - us ob tv · 15 of 91 slices shown]
[im 1/91]
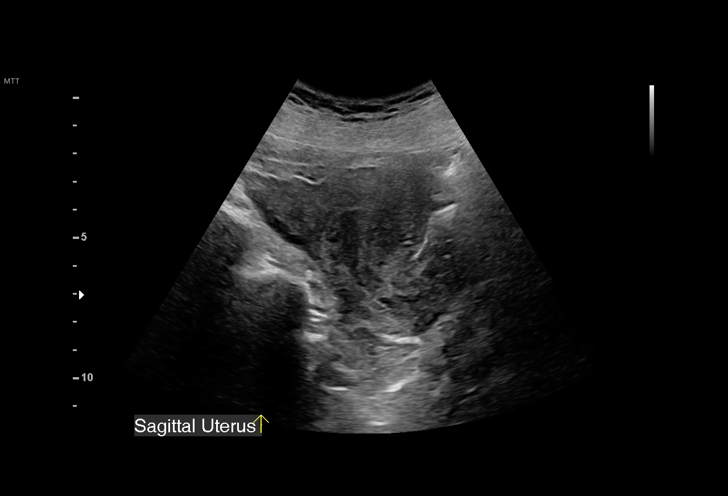
[im 7/91]
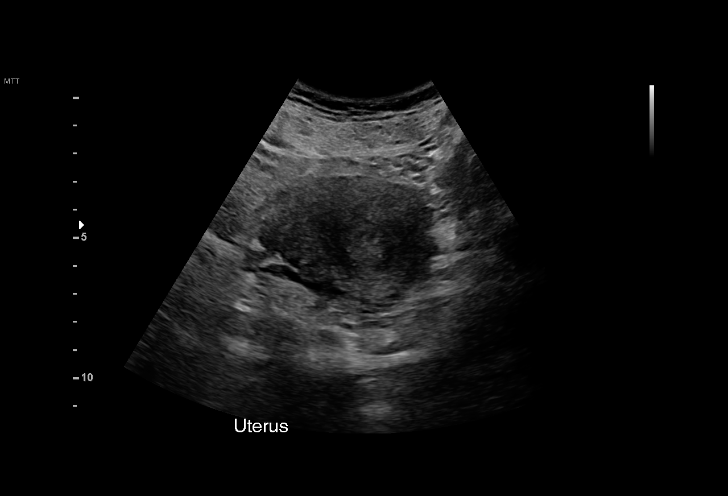
[im 14/91]
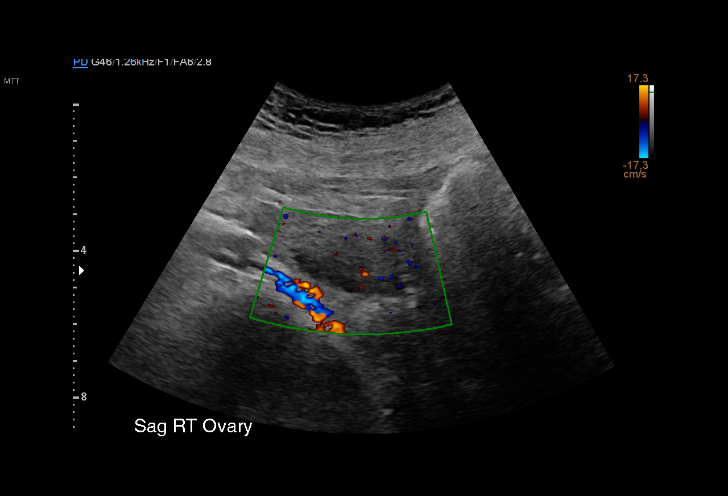
[im 21/91]
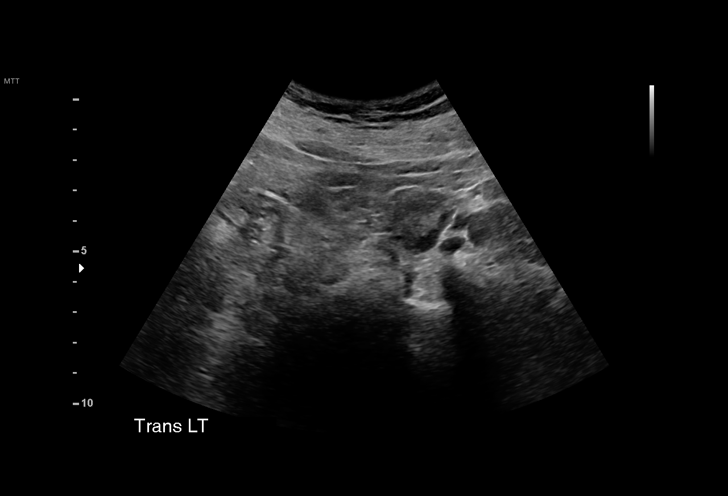
[im 27/91]
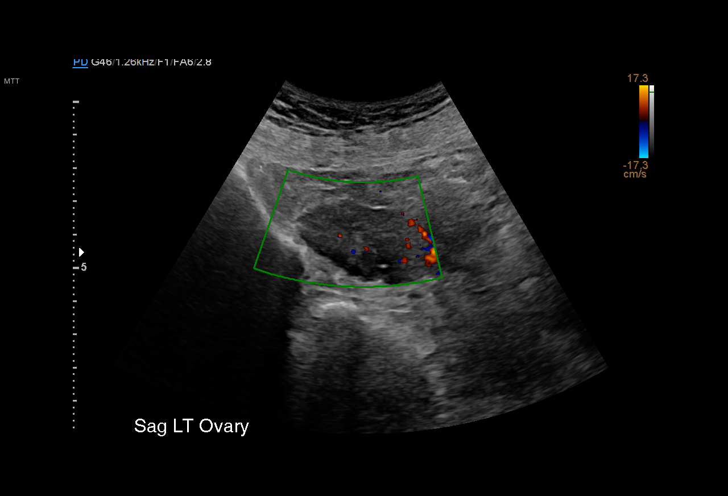
[im 34/91]
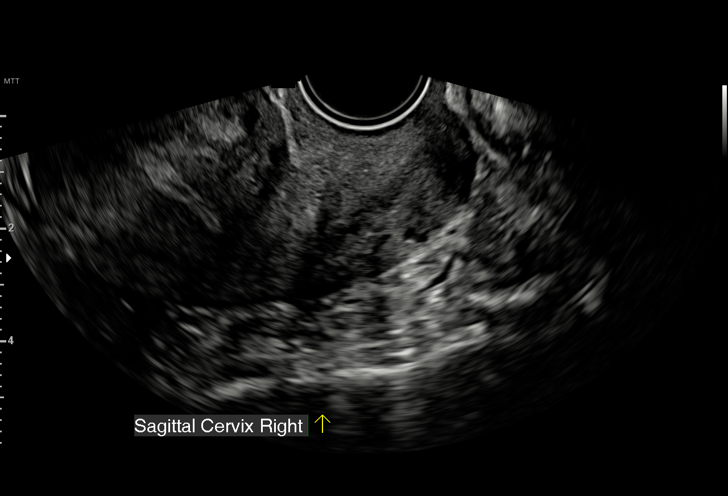
[im 41/91]
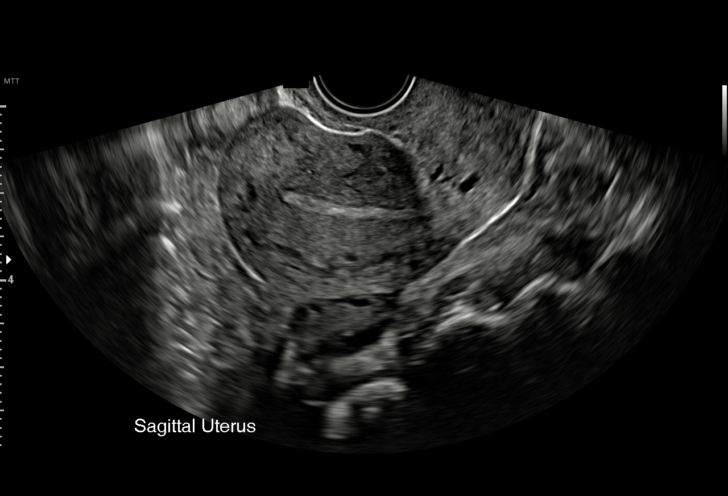
[im 47/91]
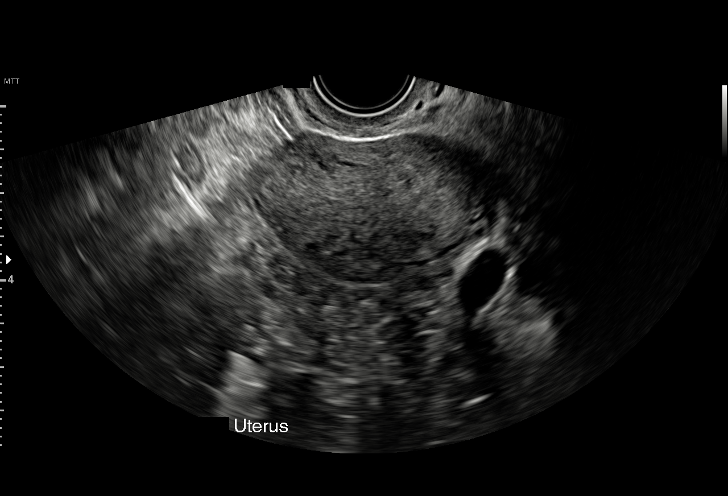
[im 51/91]
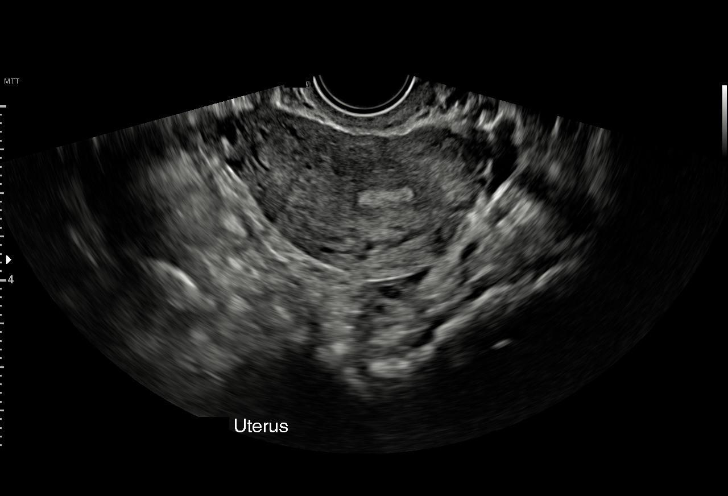
[im 57/91]
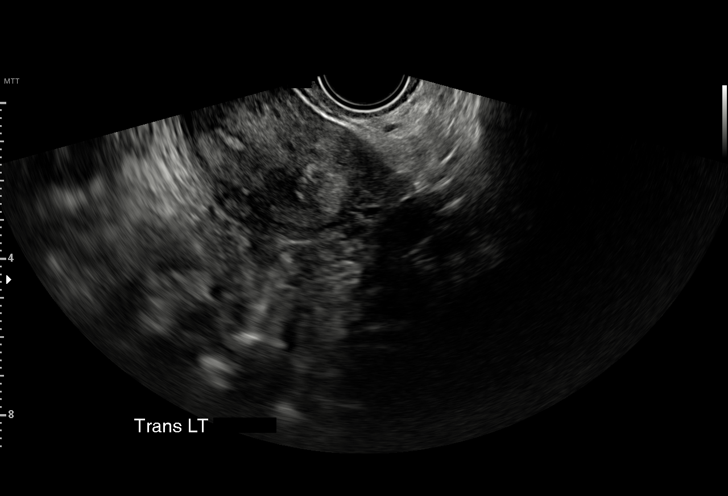
[im 64/91]
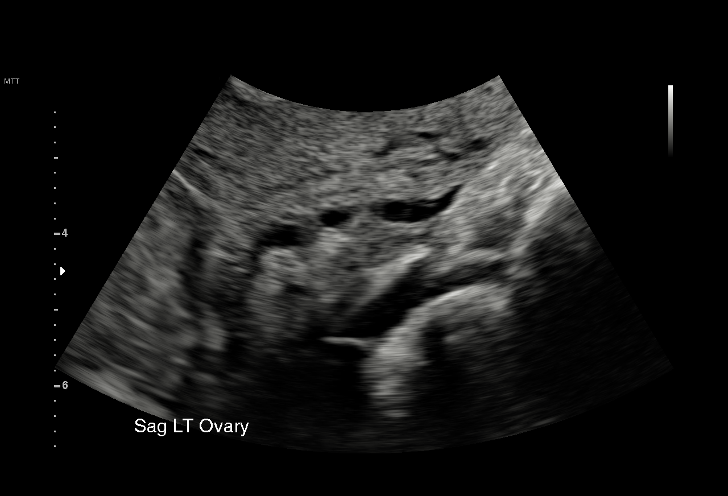
[im 71/91]
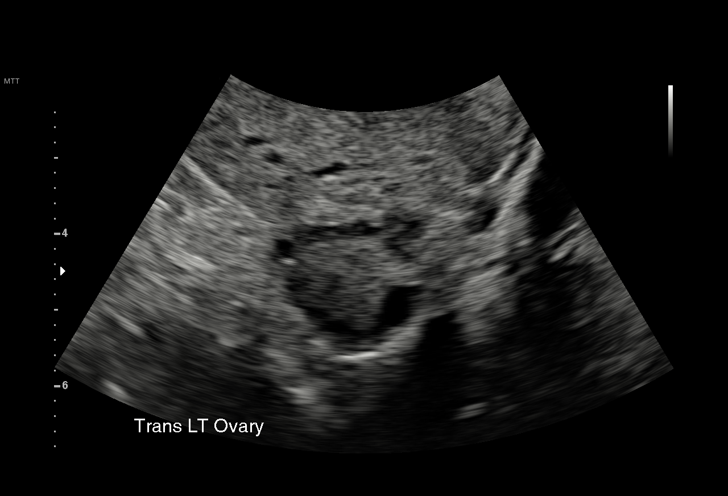
[im 77/91]
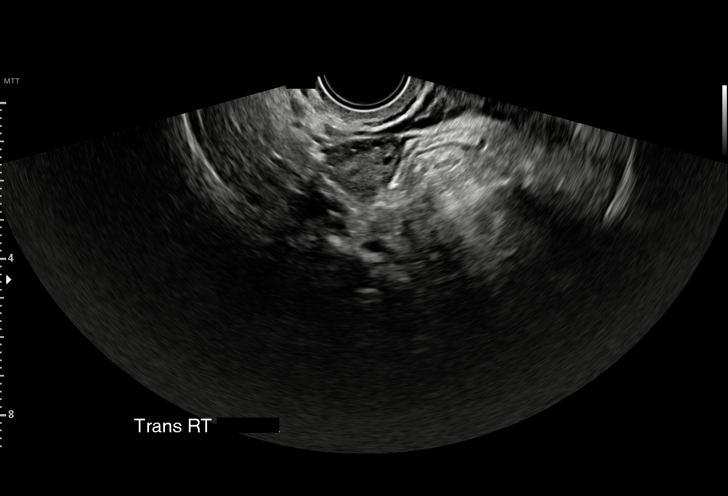
[im 84/91]
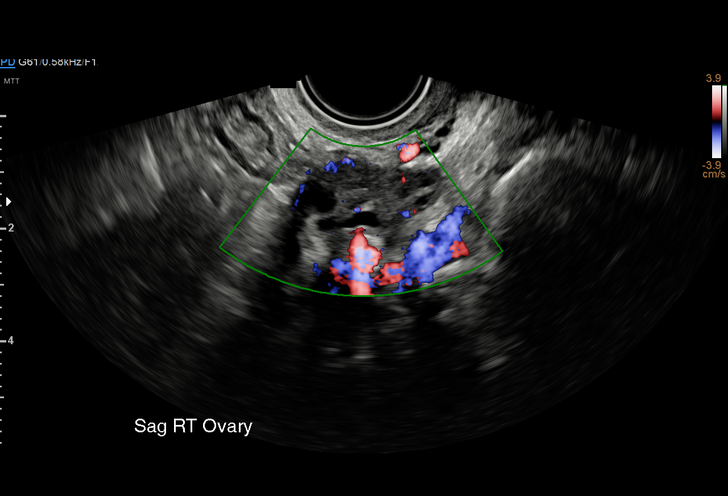
[im 91/91]
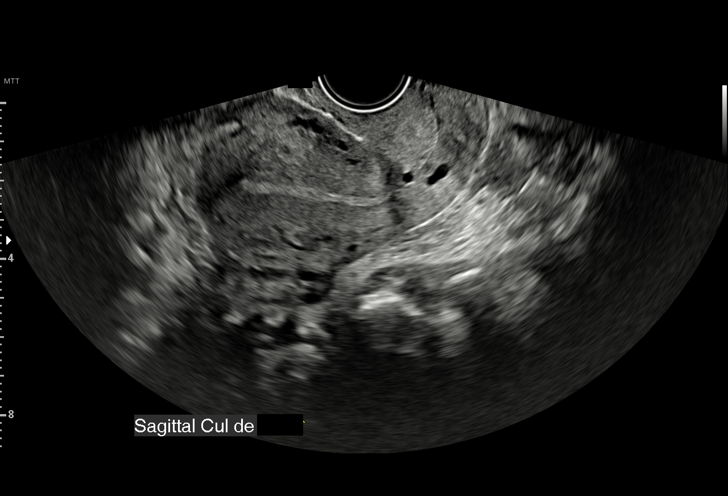

[15 of 28 positions shown; findings below may reference images not displayed]

FINDINGS: Intrauterine gestational sac: None

Yolk sac:  Not Visualized.

Embryo:  Not Visualized.

Cardiac Activity: Not Visualized.

Subchorionic hemorrhage:  None visualized.

Maternal uterus/adnexae: Ovaries are unremarkable. Trace free fluid
is noted which most likely is physiologic.
IMPRESSION: No intrauterine gestational sac, yolk sac, fetal pole, or cardiac
activity visualized. Differential considerations include
intrauterine gestation too early to be sonographically visualized,
spontaneous abortion, or ectopic pregnancy. Consider follow-up
ultrasound in 14 days and serial quantitative beta HCG follow-up.

## 2022-06-02 IMAGING — US US MFM OB COMP +14 WKS
1 series · 13 of 28 positions shown · non-contrast
Comparison: none

[Series 1: us mfm ob comp +14 wks · 128 acquisitions, 13 frames shown]
[im 5/128]
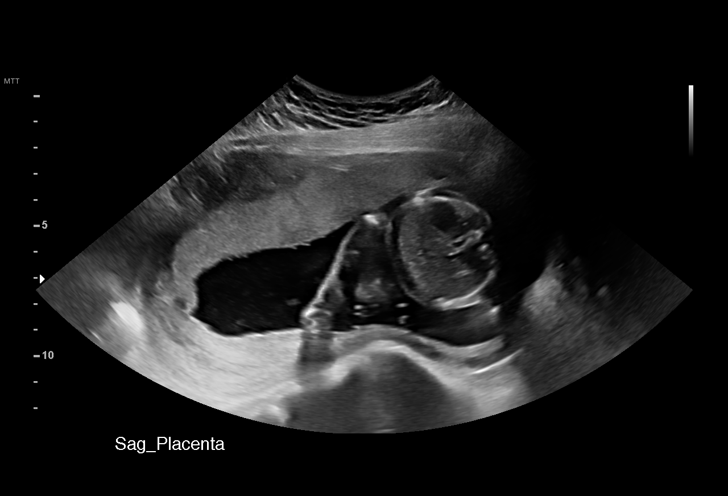
[im 15/128]
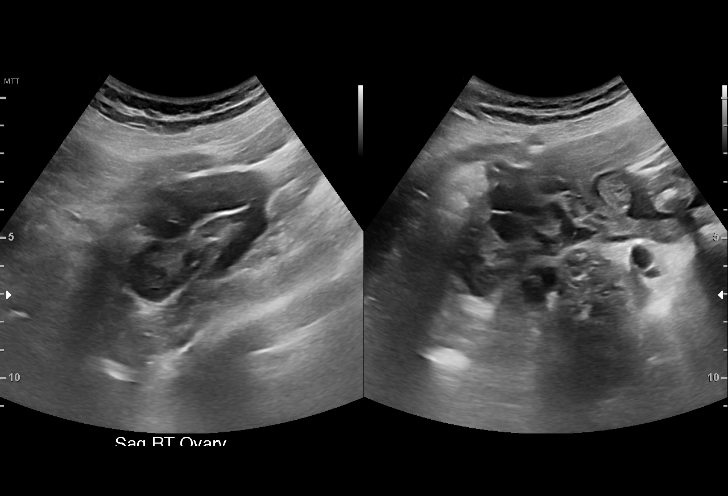
[im 24/128]
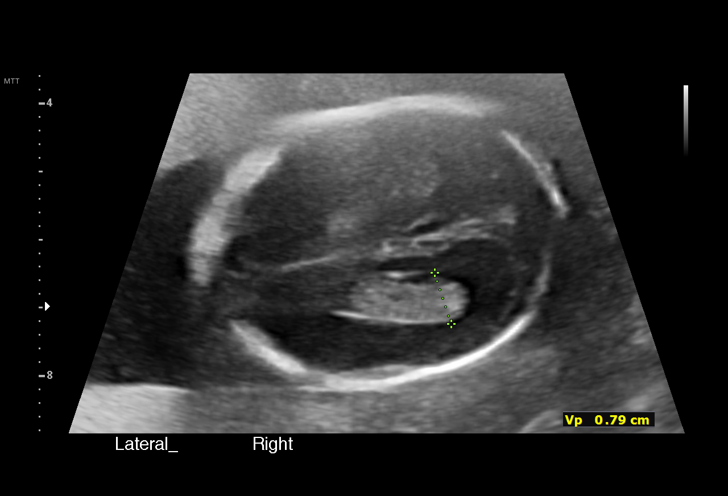
[im 33/128]
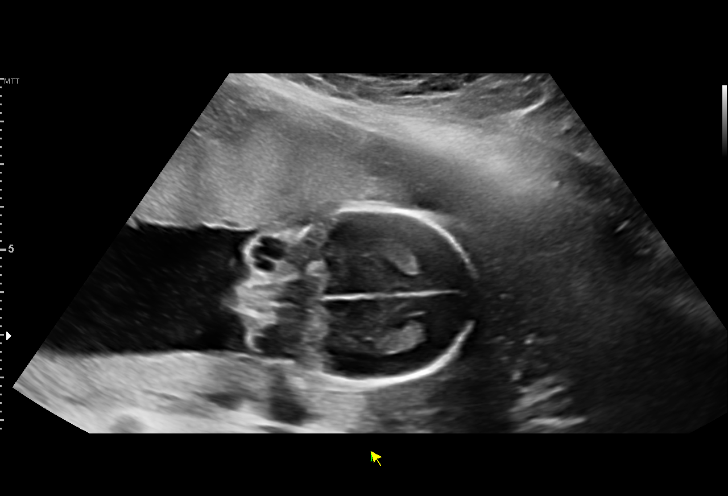
[im 43/128]
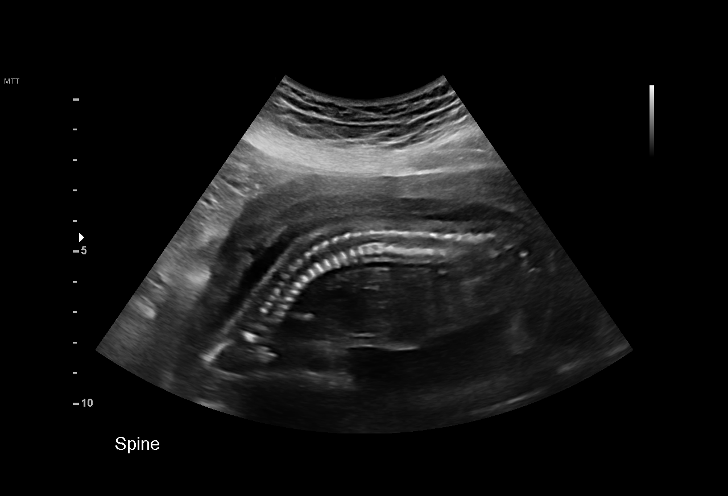
[im 52/128]
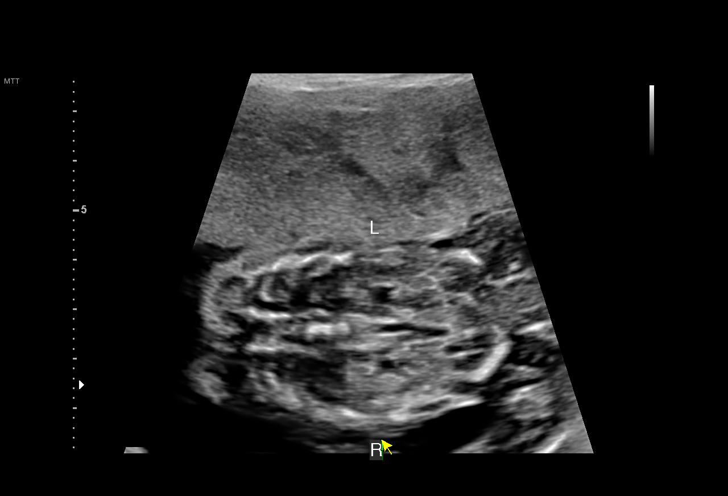
[im 66/128]
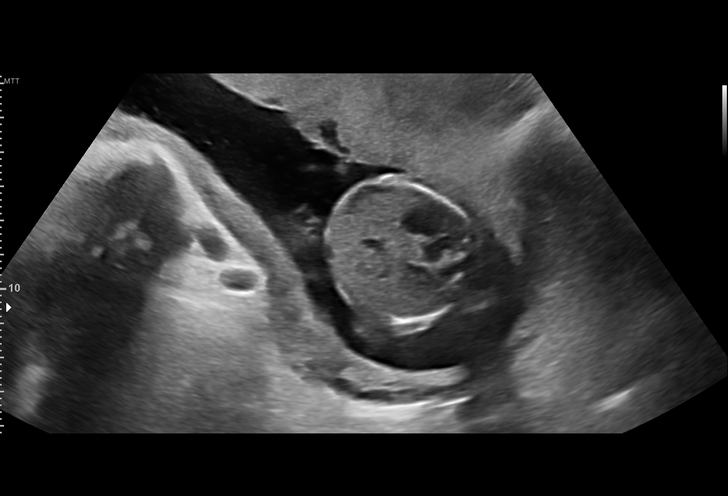
[im 76/128]
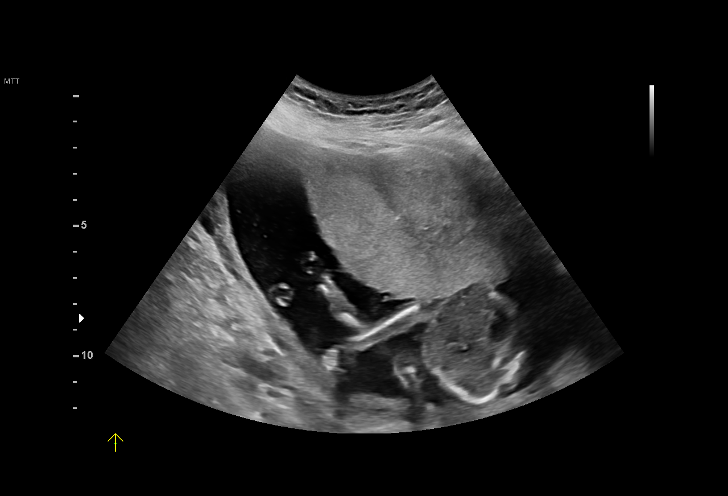
[im 85/128]
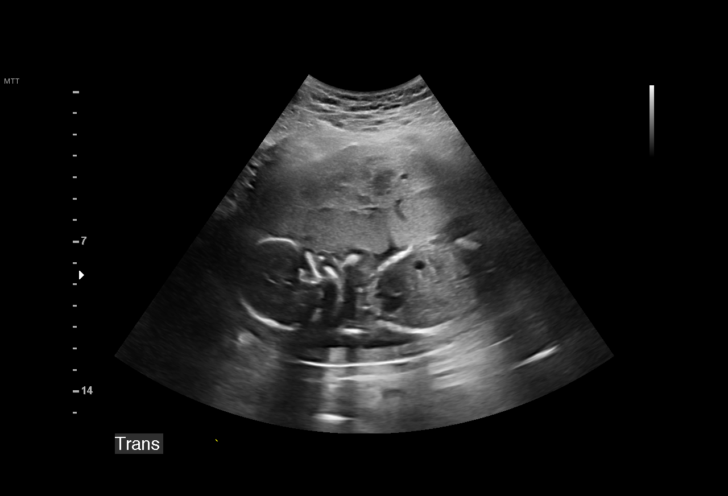
[im 95/128]
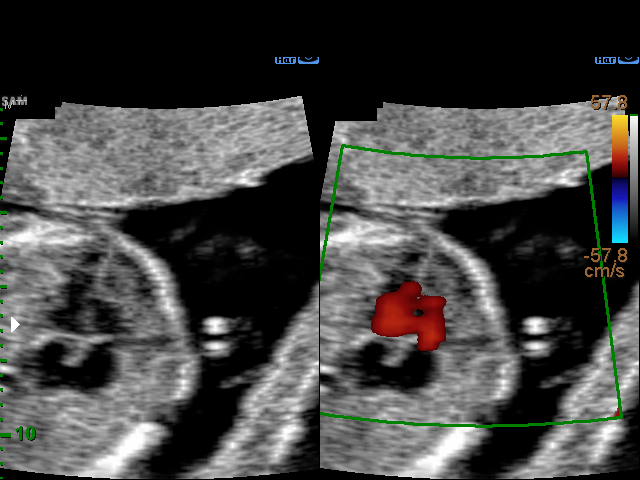
[im 104/128]
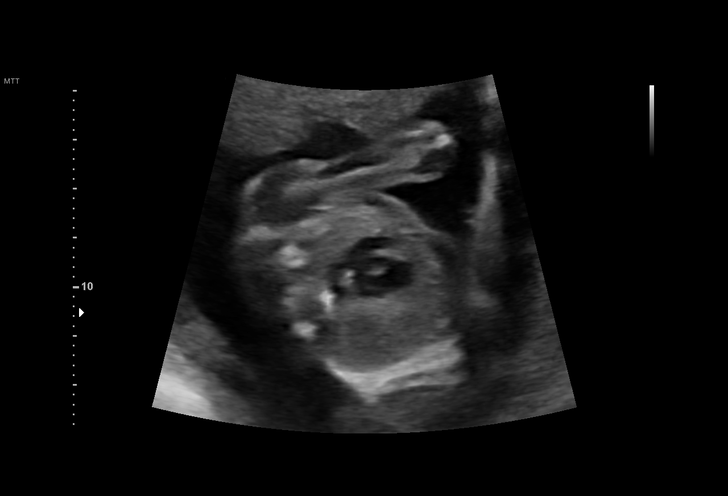
[im 113/128]
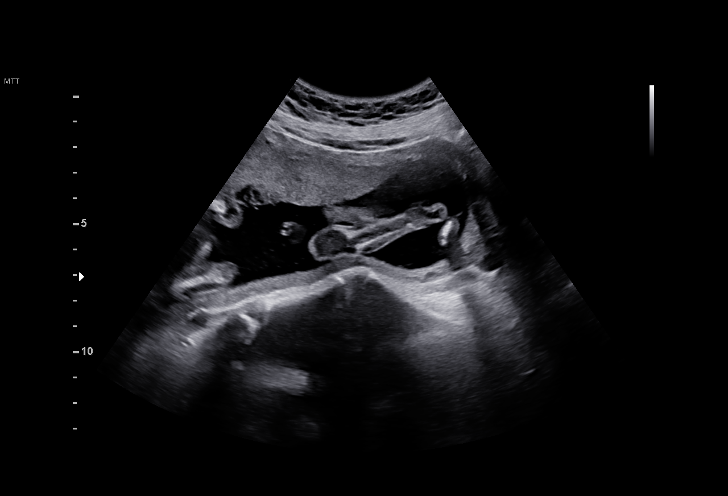
[im 123/128]
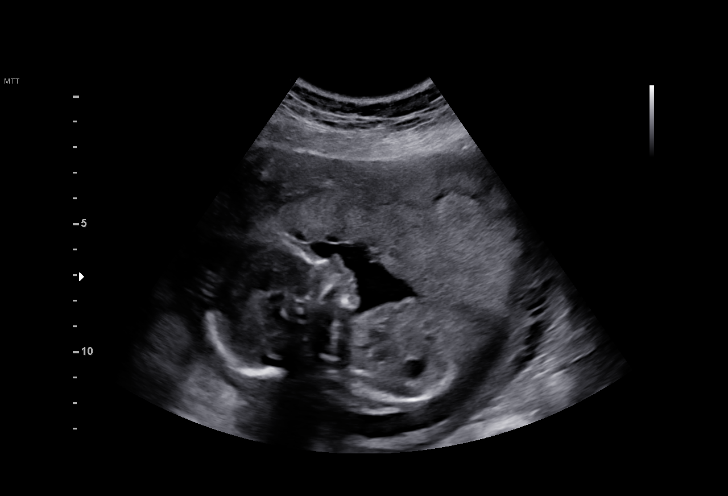

[13 of 28 positions shown; findings below may reference images not displayed]

[id number]

Indications

 Encounter for antenatal screening for
 malformations
 19 weeks gestation of pregnancy
 LR NIPS/ Negative Horizon/ Negative AFP
Fetal Evaluation

 Num Of Fetuses:         1
 Fetal Heart Rate(bpm):  144
 Cardiac Activity:       Observed
 Presentation:           Variable
 Placenta:               Anterior
 P. Cord Insertion:      Visualized, central

 Amniotic Fluid
 AFI FV:      Within normal limits
Biometry

 BPD:      40.9  mm     G. Age:  18w 3d         12  %    CI:        65.13   %    70 - 86
                                                         FL/HC:      17.0   %    16.1 -
 HC:      162.8  mm     G. Age:  19w 0d         25  %    HC/AC:      1.13        1.09 -
 AC:      143.9  mm     G. Age:  19w 5d         55  %    FL/BPD:     67.7   %
 FL:       27.7  mm     G. Age:  18w 3d         13  %    FL/AC:      19.2   %    20 - 24
 HUM:      28.5  mm     G. Age:  19w 1d         46  %
 CER:      20.2  mm     G. Age:  19w 3d         56  %
 NFT:       4.6  mm
 LV:        7.9  mm
 CM:        4.2  mm

 Est. FW:     274  gm    0 lb 10 oz      27  %
OB History

 Blood Type:   A+
 Gravidity:    4         Term:   0        Prem:   0        SAB:   1
 TOP:          2       Ectopic:  0        Living: 0
Gestational Age

 LMP:           19w 3d        Date:  01/02/20                 EDD:   10/08/20
 U/S Today:     18w 6d                                        EDD:   10/12/20
 Best:          19w 3d     Det. By:  LMP  (01/02/20)          EDD:   10/08/20
Anatomy

 Cranium:               Appears normal         Aortic Arch:            Appears normal
 Cavum:                 Appears normal         Ductal Arch:            Not well visualized
 Ventricles:            Appears normal         Diaphragm:              Appears normal
 Choroid Plexus:        Appears normal         Stomach:                Appears normal, left
                                                                       sided
 Cerebellum:            Appears normal         Abdomen:                Appears normal
 Posterior Fossa:       Appears normal         Abdominal Wall:         Appears nml (cord
                                                                       insert, abd wall)
 Nuchal Fold:           Appears normal         Cord Vessels:           Appears normal (3
                                                                       vessel cord)
 Face:                  Orbits nl; profile not Kidneys:                Appear normal
                        well visualized
 Lips:                  Appears normal         Bladder:                Appears normal
 Thoracic:              Appears normal         Spine:                  Appears normal
 Heart:                 Appears normal         Upper Extremities:      Appears normal
                        (4CH, axis, and
                        situs)
 RVOT:                  Appears normal         Lower Extremities:      Appears normal
 LVOT:                  Appears normal

 Other:  Fetus appears to be female. Heels/feet and open hands/5th digits
         visualized. Technically difficult due to fetal position.
Cervix Uterus Adnexa

 Cervix
 Length:           3.85  cm.
 Normal appearance by transabdominal scan.

 Uterus
 No abnormality visualized.

 Right Ovary
 Within normal limits.

 Left Ovary
 Within normal limits.
 Cul De Sac
 No free fluid seen.

 Adnexa
 No abnormality visualized.
Comments

 This patient was seen for a detailed fetal anatomy scan.
 She denies any significant past medical history and denies
 any problems in her current pregnancy.
 She had a cell free DNA test earlier in her pregnancy which
 indicated a low risk for trisomy 21, 18, and 13. A female fetus
 is predicted.
 She was informed that the fetal growth and amniotic fluid
 level were appropriate for her gestational age.
 There were no obvious fetal anomalies noted on today's
 ultrasound exam.  However, the views of the fetal anatomy
 were limited today due to the fetal position.
 The patient was informed that anomalies may be missed due
 to technical limitations. If the fetus is in a suboptimal position
 or maternal habitus is increased, visualization of the fetus in
 the maternal uterus may be impaired.
 A follow-up exam was scheduled in 4 weeks to complete the
 views of the fetal anatomy (cardiac and face).

## 2022-06-25 ENCOUNTER — Telehealth: Payer: Self-pay

## 2022-06-25 NOTE — Transitions of Care (Post Inpatient/ED Visit) (Unsigned)
   06/25/2022  Name: Carrie Sheppard MRN: [id number] DOB: [birth year]  Today's TOC FU Call Status: Today's TOC FU Call Status:: Unsuccessul Call (1st Attempt) Unsuccessful Call (1st Attempt) Date: 06/25/22  Attempted to reach the patient regarding the most recent Inpatient/ED visit.  Follow Up Plan: Additional outreach attempts will be made to reach the patient to complete the Transitions of Care (Post Inpatient/ED visit) call.   Signature Karena Addison, LPN [facility] Nurse Health Advisor Direct Dial [phone]

## 2022-06-27 NOTE — Transitions of Care (Post Inpatient/ED Visit) (Signed)
   06/27/2022  Name: Carrie Sheppard MRN: [id number] DOB: 1985-03-19  Today's TOC FU Call Status: Today's TOC FU Call Status:: Unsuccessful Call (2nd Attempt) Unsuccessful Call (1st Attempt) Date: 06/25/22 Unsuccessful Call (2nd Attempt) Date: 06/27/22  Attempted to reach the patient regarding the most recent Inpatient/ED visit.  Follow Up Plan: Additional outreach attempts will be made to reach the patient to complete the Transitions of Care (Post Inpatient/ED visit) call.   Signature Karena Addison, LPN [facility] Nurse Health Advisor Direct Dial [phone]

## 2022-06-28 NOTE — Transitions of Care (Post Inpatient/ED Visit) (Signed)
   06/28/2022  Name: Carrie Sheppard MRN: [id number] DOB: April 10, 1985  Today's TOC FU Call Status: Today's TOC FU Call Status:: Unsuccessful Call (3rd Attempt) Unsuccessful Call (1st Attempt) Date: 06/25/22 Unsuccessful Call (2nd Attempt) Date: 06/27/22 Unsuccessful Call (3rd Attempt) Date: 06/28/22  Attempted to reach the patient regarding the most recent Inpatient/ED visit.  Follow Up Plan: No further outreach attempts will be made at this time. We have been unable to contact the patient.  Signature Karena Addison, LPN [facility] Nurse Health Advisor Direct Dial [phone]
